# Patient Record
Sex: Female | Born: 1995 | Race: Black or African American | Hispanic: No | Marital: Single | Smoking: Never smoker
Health system: Southern US, Community
[De-identification: ages and names within clinical notes are randomized; demographics above are authoritative.]

## PROBLEM LIST (undated history)

## (undated) ENCOUNTER — Inpatient Hospital Stay (HOSPITAL_COMMUNITY): Payer: Self-pay

## (undated) DIAGNOSIS — E282 Polycystic ovarian syndrome: Secondary | ICD-10-CM

## (undated) DIAGNOSIS — I10 Essential (primary) hypertension: Secondary | ICD-10-CM

## (undated) DIAGNOSIS — Z6841 Body Mass Index (BMI) 40.0 and over, adult: Secondary | ICD-10-CM

## (undated) DIAGNOSIS — R7303 Prediabetes: Secondary | ICD-10-CM

## (undated) DIAGNOSIS — Z8759 Personal history of other complications of pregnancy, childbirth and the puerperium: Secondary | ICD-10-CM

## (undated) HISTORY — DX: Body Mass Index (BMI) 40.0 and over, adult: Z684

## (undated) HISTORY — DX: Prediabetes: R73.03

## (undated) HISTORY — PX: KNEE SURGERY: SHX244

## (undated) HISTORY — DX: Polycystic ovarian syndrome: E28.2

## (undated) HISTORY — DX: Personal history of other complications of pregnancy, childbirth and the puerperium: Z87.59

## (undated) HISTORY — PX: WISDOM TOOTH EXTRACTION: SHX21

## (undated) HISTORY — DX: Essential (primary) hypertension: I10

---

## 2016-08-05 NOTE — L&D Delivery Note (Signed)
Pre-Op: IUP 16 0/7 weeks SROM  Post-Op: SAA  Procedure: SVD  Anesthesia: None  Attending: M. Baylor Teegarden  Findings: non viable female infant at 1835, 3 vessel cord, intact placenta, no lacerations  Procedure:  Pt was admitted with above Dx. Received Cytotec vaginal. Felt pressure and went to restroom. Delivered non viable female infant at [id number]. Cord clamped and cut by nursing. Placenta spontaneously at [id number]. Intact. Infant expired at 1843. No lacerations or tears noted. Min bleeding. Mother held baby. Appropriate grief response.  Placenta to pathology.

## 2016-11-29 ENCOUNTER — Ambulatory Visit (INDEPENDENT_AMBULATORY_CARE_PROVIDER_SITE_OTHER): Payer: BLUE CROSS/BLUE SHIELD

## 2016-11-29 ENCOUNTER — Encounter: Payer: Self-pay | Admitting: *Deleted

## 2016-11-29 DIAGNOSIS — Z3201 Encounter for pregnancy test, result positive: Secondary | ICD-10-CM

## 2016-11-29 DIAGNOSIS — Z3200 Encounter for pregnancy test, result unknown: Secondary | ICD-10-CM

## 2016-11-29 LAB — POCT URINE PREGNANCY: Preg Test, Ur: POSITIVE — AB

## 2016-11-29 NOTE — Progress Notes (Signed)
Patient presents for UPT.  Pregnancy Test is POSITIVE. LMP 09/13/16.  11w 0d. EDD 06/20/17.  Patient advised to scheduled NEW OB appt.

## 2016-12-08 ENCOUNTER — Encounter (HOSPITAL_COMMUNITY): Payer: Self-pay | Admitting: *Deleted

## 2016-12-08 ENCOUNTER — Inpatient Hospital Stay (HOSPITAL_COMMUNITY)
Admission: AD | Admit: 2016-12-08 | Discharge: 2016-12-08 | Disposition: A | Discharge status: Home / Self Care | Payer: BLUE CROSS/BLUE SHIELD | Source: Ambulatory Visit | Attending: Obstetrics and Gynecology | Admitting: Obstetrics and Gynecology

## 2016-12-08 ENCOUNTER — Ambulatory Visit (HOSPITAL_COMMUNITY)
Admission: EM | Admit: 2016-12-08 | Discharge: 2016-12-08 | Disposition: A | Discharge status: Nursing Home (Includes ICF) | Payer: BLUE CROSS/BLUE SHIELD

## 2016-12-08 DIAGNOSIS — Z79899 Other long term (current) drug therapy: Secondary | ICD-10-CM | POA: Diagnosis not present

## 2016-12-08 DIAGNOSIS — O26891 Other specified pregnancy related conditions, first trimester: Secondary | ICD-10-CM | POA: Insufficient documentation

## 2016-12-08 DIAGNOSIS — O219 Vomiting of pregnancy, unspecified: Secondary | ICD-10-CM | POA: Insufficient documentation

## 2016-12-08 DIAGNOSIS — Z3A12 12 weeks gestation of pregnancy: Secondary | ICD-10-CM | POA: Insufficient documentation

## 2016-12-08 DIAGNOSIS — R079 Chest pain, unspecified: Secondary | ICD-10-CM | POA: Diagnosis not present

## 2016-12-08 LAB — URINALYSIS, ROUTINE W REFLEX MICROSCOPIC
Bilirubin Urine: NEGATIVE
Glucose, UA: NEGATIVE mg/dL
Hgb urine dipstick: NEGATIVE
Ketones, ur: NEGATIVE mg/dL
Nitrite: NEGATIVE
Protein, ur: 30 mg/dL — AB
Specific Gravity, Urine: 1.023 (ref 1.005–1.030)
pH: 7 (ref 5.0–8.0)

## 2016-12-08 MED ORDER — RANITIDINE HCL 150 MG PO CAPS: 150.0000 mg | ORAL_CAPSULE | Freq: Every day | ORAL | 0 refills | Status: DC

## 2016-12-08 MED ORDER — PROMETHAZINE HCL 25 MG PO TABS: 25.0000 mg | ORAL_TABLET | Freq: Four times a day (QID) | ORAL | 0 refills | Status: DC | PRN

## 2016-12-08 MED ORDER — VITAFOL GUMMIES 3.33-0.333-34.8 MG PO CHEW: 3.0000 | CHEWABLE_TABLET | Freq: Every day | ORAL | 2 refills | Status: AC

## 2016-12-08 NOTE — MAU Note (Signed)
Patient states she has started vomiting more blood with her episodes of emesis. States it started as streaks. Reports left sided mid chest pain that start last night--intermittent pain. States she feels like her pulse is high.

## 2016-12-08 NOTE — Discharge Instructions (Signed)
Get your prescriptions at the pharmacy. Take the gummies daily. Take the Zantac twice daily. Keep your appointment for prenatal care.  Eat bland, easy to digest foods in small amounts as you are able, such as: ? Bananas. ? Applesauce. ? Rice. ? Lean meats. ? Toast. ? Crackers. Do not eat oils or fried foods.  Drink at least 8 8-oz glasses of water every day. Take Tylenol 325 mg 2 tablets by mouth every 4 hours if needed for pain.

## 2016-12-08 NOTE — MAU Provider Note (Signed)
History     CSN: [id number]  Arrival date and time: 12/08/16 1233   First Provider Initiated Contact with Patient 12/08/16 1300      Chief Complaint  Patient presents with  . Chest Pain  . Tachycardia  . Emesis   HPI Lisa Faulkner 20 y.o. 3943w2d  Comes in today with vomiting blood this morning and feeling periodic pain in her chest.  Has had vomiting almost daily throughout the pregnancy.  Has had streaks of blood in the vomitus, but today had blood in her mouth that she could taste.  Client is very worried as she does not think she is getting enough food in the pregnancy although lots of times when she is vomiting, it is green or yellow - not food particles.   Eats small amounts.  Vomits approximately 3 times a day.  Has not tried crackers before getting up in the morning.  Is not able to take her prenatal vitamins.  Has her first prenatal care appointment on May 10.  Has had a long history of GI problems.  When she lived in [place] in 2017, she was told she likely had irritable bowel problems.  Also her mother had a history of vomiting throughout her pregnancies.    OB History    Gravida Para Term Preterm AB Living   1             SAB TAB Ectopic Multiple Live Births                  Past Medical History:  Diagnosis Date  . Medical history non-contributory     Past Surgical History:  Procedure Laterality Date  . KNEE SURGERY    . WISDOM TOOTH EXTRACTION      Family History  Problem Relation Age of Onset  . Hypertension Mother   . Hypertension Father   . Hypertension Brother   . Heart disease Brother     Social History  Substance Use Topics  . Smoking status: Never Smoker  . Smokeless tobacco: Never Used  . Alcohol use No    Allergies: No Known Allergies  Prescriptions Prior to Admission  Medication Sig Dispense Refill Last Dose  . calcium carbonate (TUMS - DOSED IN MG ELEMENTAL CALCIUM) 500 MG chewable tablet Chew 1 tablet by mouth every 3 (three)  hours as needed for indigestion or heartburn.   12/07/2016 at Unknown time    Review of Systems  Constitutional: Negative for fever.  Respiratory: Negative for chest tightness and shortness of breath.   Cardiovascular: Positive for chest pain.  Gastrointestinal:       Having upper abdominal pain primarily on left side and midline.  Genitourinary: Negative for vaginal bleeding and vaginal discharge.   Physical Exam   Blood pressure 130/68, pulse 91, temperature 98.2 F (36.8 C), temperature source Oral, resp. rate 18, height 5\' 5"  (1.651 m), weight 264 lb (119.7 kg), last menstrual period 09/13/2016, SpO2 100 %.  Physical Exam  Nursing note and vitals reviewed. Constitutional: She is oriented to person, place, and time. She appears well-developed and well-nourished.  HENT:  Head: Normocephalic.  Eyes: EOM are normal.  Neck: Neck supple.  Cardiovascular: Normal rate and regular rhythm.   Respiratory: Effort normal. No respiratory distress.  GI: Soft. There is no tenderness. There is no rebound and no guarding.  FHT heard by doppler.  Musculoskeletal: Normal range of motion.  Neurological: She is alert and oriented to person, place, and time.  Skin:  Skin is warm and dry.  Psychiatric: She has a normal mood and affect.    MAU Course  Procedures Results for orders placed or performed during the hospital encounter of 12/08/16 (from the past 24 hour(s))  Urinalysis, Routine w reflex microscopic     Status: Abnormal   Collection Time: 12/08/16 12:41 PM  Result Value Ref Range   Color, Urine YELLOW YELLOW   APPearance HAZY (A) CLEAR   Specific Gravity, Urine 1.023 1.005 - 1.030   pH 7.0 5.0 - 8.0   Glucose, UA NEGATIVE NEGATIVE mg/dL   Hgb urine dipstick NEGATIVE NEGATIVE   Bilirubin Urine NEGATIVE NEGATIVE   Ketones, ur NEGATIVE NEGATIVE mg/dL   Protein, ur 30 (A) NEGATIVE mg/dL   Nitrite NEGATIVE NEGATIVE   Leukocytes, UA LARGE (A) NEGATIVE   RBC / HPF 6-30 0 - 5 RBC/hpf    WBC, UA TOO NUMEROUS TO COUNT 0 - 5 WBC/hpf   Bacteria, UA RARE (A) NONE SEEN   Squamous Epithelial / LPF TOO NUMEROUS TO COUNT (A) NONE SEEN   Mucous PRESENT    MDM The bleeding she had with vomiting is likely normal due to irritated lining of the throat.  Discussed with client.  Will try to calm heartburn - Tums are not working for her.  Discussed dietary habits with vomiting.  And noted that she is not spilling ketones so she is getting enough calorie intake.  Discussed chest pain - not extensive and is not worsening - may even be chest wall pain due to the vomiting.  Advised to seek care if chest pain is continuous and has pressure especially if she is sweating or feeling faint.  Has no UTI symptoms and this is a contaminated specimen.  Will check urine again this week at the office.  Client is much more at ease after discussion.  Ready for discharge.  Assessment and Plan  Nausea and vomiting in Pregnancy  Plan Will prescribe Zantac to take BID Will Prescribe Phenergan to take 12.5 mg PO as needed if vomiting is severe Will prescribe gummy PNV to see if they are better tolerated. Advised to keep appointment at [facility] on May 10.  Joshue Badal L Tamari Redwine 12/08/2016, 2:00 PM

## 2016-12-17 ENCOUNTER — Encounter: Payer: Self-pay | Admitting: Family Medicine

## 2016-12-17 ENCOUNTER — Ambulatory Visit (INDEPENDENT_AMBULATORY_CARE_PROVIDER_SITE_OTHER): Payer: BLUE CROSS/BLUE SHIELD | Admitting: Family Medicine

## 2016-12-17 DIAGNOSIS — Z113 Encounter for screening for infections with a predominantly sexual mode of transmission: Secondary | ICD-10-CM | POA: Diagnosis not present

## 2016-12-17 DIAGNOSIS — Z124 Encounter for screening for malignant neoplasm of cervix: Secondary | ICD-10-CM | POA: Diagnosis not present

## 2016-12-17 DIAGNOSIS — K5900 Constipation, unspecified: Secondary | ICD-10-CM | POA: Insufficient documentation

## 2016-12-17 DIAGNOSIS — Z3480 Encounter for supervision of other normal pregnancy, unspecified trimester: Secondary | ICD-10-CM | POA: Insufficient documentation

## 2016-12-17 DIAGNOSIS — Z3481 Encounter for supervision of other normal pregnancy, first trimester: Secondary | ICD-10-CM | POA: Diagnosis not present

## 2016-12-17 DIAGNOSIS — O219 Vomiting of pregnancy, unspecified: Secondary | ICD-10-CM

## 2016-12-17 DIAGNOSIS — Z3689 Encounter for other specified antenatal screening: Secondary | ICD-10-CM | POA: Diagnosis not present

## 2016-12-17 DIAGNOSIS — K5901 Slow transit constipation: Secondary | ICD-10-CM

## 2016-12-17 DIAGNOSIS — O99211 Obesity complicating pregnancy, first trimester: Secondary | ICD-10-CM

## 2016-12-17 MED ORDER — DOXYLAMINE-PYRIDOXINE ER 20-20 MG PO TBCR: 1.0000 | EXTENDED_RELEASE_TABLET | Freq: Two times a day (BID) | ORAL | 6 refills | Status: DC

## 2016-12-17 MED ORDER — DOCUSATE SODIUM 100 MG PO CAPS: 100.0000 mg | ORAL_CAPSULE | Freq: Two times a day (BID) | ORAL | 2 refills | Status: DC | PRN

## 2016-12-17 NOTE — Progress Notes (Signed)
    Subjective:    Lisa Faulkner is a G1P0 5447w4d being seen today for her first obstetrical visit.  Her obstetrical history is significant for obesity.Pregnancy history fully reviewed.  Patient reports nausea and constipation. She also reports epigastric pain and heartburn.  There were no vitals filed for this visit.  HISTORY: OB History  Gravida Para Term Preterm AB Living  1            SAB TAB Ectopic Multiple Live Births               # Outcome Date GA Lbr Len/2nd Weight Sex Delivery Anes PTL Lv  1 Current              Past Medical History:  Diagnosis Date  . Medical history non-contributory   . PCOS (polycystic ovarian syndrome)    Past Surgical History:  Procedure Laterality Date  . KNEE SURGERY    . WISDOM TOOTH EXTRACTION     Family History  Problem Relation Age of Onset  . Hypertension Mother   . Hypertension Father   . Hypertension Brother   . Heart disease Brother      Exam    Uterus:   14 wk size  Pelvic Exam:    Perineum: Normal Perineum   Vulva: Bartholin's, Urethra, Skene's normal   Vagina:  normal mucosa, normal discharge   Cervix: nulliparous appearance   Adnexa: normal adnexa   Bony Pelvis: average  System: Breast:  normal appearance, no masses or tenderness   Skin: normal coloration and turgor, no rashes    Neurologic: oriented   Extremities: normal strength, tone, and muscle mass   HEENT extra ocular movement intact and sclera clear, anicteric   Mouth/Teeth mucous membranes moist, pharynx normal without lesions   Neck supple   Cardiovascular: regular rate and rhythm, no murmurs or gallops   Respiratory:  appears well, vitals normal, no respiratory distress, acyanotic, normal RR, ear and throat exam is normal, neck free of mass or lymphadenopathy, chest clear, no wheezing, crepitations, rhonchi, normal symmetric air entry   Abdomen: soft, non-tender; bowel sounds normal; no masses,  no organomegaly      Assessment/Plan:    Pregnancy: G1P0 1. Supervision of other normal pregnancy, antepartum New OB history reviewed Too late for first screen Options reviewed--offer quad--still considering. - Cytology - PAP - Culture, OB Urine - Glucose, random - Hemoglobin A1c - Inheritest Society Guided - US bedside; Future - US MFM OB COMP + 14 WK; Future - Obstetric Panel, Including HIV  2. Slow transit constipation Increase water and fiber - docusate sodium (COLACE) 100 MG capsule; Take 1 capsule (100 mg total) by mouth 2 (two) times daily as needed.  Dispense: 30 capsule; Refill: 2  3. Nausea and vomiting of pregnancy, antepartum Use Bonjesta for prevention and only phenergan as needed. - Doxylamine-Pyridoxine ER (BONJESTA) 20-20 MG TBCR; Take 1 tablet by mouth 2 (two) times daily.  Dispense: 60 tablet; Refill: 6  Reva Boresanya S Wynell Halberg 12/17/2016

## 2016-12-17 NOTE — Patient Instructions (Signed)
Breastfeeding Deciding to breastfeed is one of the best choices you can make for you and your baby. A change in hormones during pregnancy causes your breast tissue to grow and increases the number and size of your milk ducts. These hormones also allow proteins, sugars, and fats from your blood supply to make breast milk in your milk-producing glands. Hormones prevent breast milk from being released before your baby is born as well as prompt milk flow after birth. Once breastfeeding has begun, thoughts of your baby, as well as his or her sucking or crying, can stimulate the release of milk from your milk-producing glands. Benefits of breastfeeding For Your Baby  Your first milk (colostrum) helps your baby's digestive system function better.  There are antibodies in your milk that help your baby fight off infections.  Your baby has a lower incidence of asthma, allergies, and sudden infant death syndrome.  The nutrients in breast milk are better for your baby than infant formulas and are designed uniquely for your baby's needs.  Breast milk improves your baby's brain development.  Your baby is less likely to develop other conditions, such as childhood obesity, asthma, or type 2 diabetes mellitus. For You  Breastfeeding helps to create a very special bond between you and your baby.  Breastfeeding is convenient. Breast milk is always available at the correct temperature and costs nothing.  Breastfeeding helps to burn calories and helps you lose the weight gained during pregnancy.  Breastfeeding makes your uterus contract to its prepregnancy size faster and slows bleeding (lochia) after you give birth.  Breastfeeding helps to lower your risk of developing type 2 diabetes mellitus, osteoporosis, and breast or ovarian cancer later in life. Signs that your baby is hungry Early Signs of Hunger  Increased alertness or activity.  Stretching.  Movement of the head from side to side.  Movement  of the head and opening of the mouth when the corner of the mouth or cheek is stroked (rooting).  Increased sucking sounds, smacking lips, cooing, sighing, or squeaking.  Hand-to-mouth movements.  Increased sucking of fingers or hands. Late Signs of Hunger  Fussing.  Intermittent crying. Extreme Signs of Hunger  Signs of extreme hunger will require calming and consoling before your baby will be able to breastfeed successfully. Do not wait for the following signs of extreme hunger to occur before you initiate breastfeeding:  Restlessness.  A loud, strong cry.  Screaming. Breastfeeding basics  Breastfeeding Initiation  Find a comfortable place to sit or lie down, with your neck and back well supported.  Place a pillow or rolled up blanket under your baby to bring him or her to the level of your breast (if you are seated). Nursing pillows are specially designed to help support your arms and your baby while you breastfeed.  Make sure that your baby's abdomen is facing your abdomen.  Gently massage your breast. With your fingertips, massage from your chest wall toward your nipple in a circular motion. This encourages milk flow. You may need to continue this action during the feeding if your milk flows slowly.  Support your breast with 4 fingers underneath and your thumb above your nipple. Make sure your fingers are well away from your nipple and your baby's mouth.  Stroke your baby's lips gently with your finger or nipple.  When your baby's mouth is open wide enough, quickly bring your baby to your breast, placing your entire nipple and as much of the colored area around your nipple (  areola) as possible into your baby's mouth.  More areola should be visible above your baby's upper lip than below the lower lip.  Your baby's tongue should be between his or her lower gum and your breast.  Ensure that your baby's mouth is correctly positioned around your nipple (latched). Your baby's  lips should create a seal on your breast and be turned out (everted).  It is common for your baby to suck about 2-3 minutes in order to start the flow of breast milk. Latching  Teaching your baby how to latch on to your breast properly is very important. An improper latch can cause nipple pain and decreased milk supply for you and poor weight gain in your baby. Also, if your baby is not latched onto your nipple properly, he or she may swallow some air during feeding. This can make your baby fussy. Burping your baby when you switch breasts during the feeding can help to get rid of the air. However, teaching your baby to latch on properly is still the best way to prevent fussiness from swallowing air while breastfeeding. Signs that your baby has successfully latched on to your nipple:  Silent tugging or silent sucking, without causing you pain.  Swallowing heard between every 3-4 sucks.  Muscle movement above and in front of his or her ears while sucking. Signs that your baby has not successfully latched on to nipple:  Sucking sounds or smacking sounds from your baby while breastfeeding.  Nipple pain. If you think your baby has not latched on correctly, slip your finger into the corner of your baby's mouth to break the suction and place it between your baby's gums. Attempt breastfeeding initiation again. Signs of Successful Breastfeeding  Signs from your baby:  A gradual decrease in the number of sucks or complete cessation of sucking.  Falling asleep.  Relaxation of his or her body.  Retention of a small amount of milk in his or her mouth.  Letting go of your breast by himself or herself. Signs from you:  Breasts that have increased in firmness, weight, and size 1-3 hours after feeding.  Breasts that are softer immediately after breastfeeding.  Increased milk volume, as well as a change in milk consistency and color by the fifth day of breastfeeding.  Nipples that are not sore,  cracked, or bleeding. Signs That Your Baby is Getting Enough Milk  Wetting at least 1-2 diapers during the first 24 hours after birth.  Wetting at least 5-6 diapers every 24 hours for the first week after birth. The urine should be clear or pale yellow by 5 days after birth.  Wetting 6-8 diapers every 24 hours as your baby continues to grow and develop.  At least 3 stools in a 24-hour period by age 5 days. The stool should be soft and yellow.  At least 3 stools in a 24-hour period by age 7 days. The stool should be seedy and yellow.  No loss of weight greater than 10% of birth weight during the first 3 days of age.  Average weight gain of 4-7 ounces (113-198 g) per week after age 4 days.  Consistent daily weight gain by age 5 days, without weight loss after the age of 2 weeks. After a feeding, your baby may spit up a small amount. This is common. Breastfeeding frequency and duration Frequent feeding will help you make more milk and can prevent sore nipples and breast engorgement. Breastfeed when you feel the need to reduce the   fullness of your breasts or when your baby shows signs of hunger. This is called "breastfeeding on demand." Avoid introducing a pacifier to your baby while you are working to establish breastfeeding (the first 4-6 weeks after your baby is born). After this time you may choose to use a pacifier. Research has shown that pacifier use during the first year of a baby's life decreases the risk of sudden infant death syndrome (SIDS). Allow your baby to feed on each breast as long as he or she wants. Breastfeed until your baby is finished feeding. When your baby unlatches or falls asleep while feeding from the first breast, offer the second breast. Because newborns are often sleepy in the first few weeks of life, you may need to awaken your baby to get him or her to feed. Breastfeeding times will vary from baby to baby. However, the following rules can serve as a guide to help  you ensure that your baby is properly fed:  Newborns (babies 4 weeks of age or younger) may breastfeed every 1-3 hours.  Newborns should not go longer than 3 hours during the day or 5 hours during the night without breastfeeding.  You should breastfeed your baby a minimum of 8 times in a 24-hour period until you begin to introduce solid foods to your baby at around 6 months of age. Breast milk pumping Pumping and storing breast milk allows you to ensure that your baby is exclusively fed your breast milk, even at times when you are unable to breastfeed. This is especially important if you are going back to work while you are still breastfeeding or when you are not able to be present during feedings. Your lactation consultant can give you guidelines on how long it is safe to store breast milk. A breast pump is a machine that allows you to pump milk from your breast into a sterile bottle. The pumped breast milk can then be stored in a refrigerator or freezer. Some breast pumps are operated by hand, while others use electricity. Ask your lactation consultant which type will work best for you. Breast pumps can be purchased, but some hospitals and breastfeeding support groups lease breast pumps on a monthly basis. A lactation consultant can teach you how to hand express breast milk, if you prefer not to use a pump. Caring for your breasts while you breastfeed Nipples can become dry, cracked, and sore while breastfeeding. The following recommendations can help keep your breasts moisturized and healthy:  Avoid using soap on your nipples.  Wear a supportive bra. Although not required, special nursing bras and tank tops are designed to allow access to your breasts for breastfeeding without taking off your entire bra or top. Avoid wearing underwire-style bras or extremely tight bras.  Air dry your nipples for 3-4minutes after each feeding.  Use only cotton bra pads to absorb leaked breast milk. Leaking of  breast milk between feedings is normal.  Use lanolin on your nipples after breastfeeding. Lanolin helps to maintain your skin's normal moisture barrier. If you use pure lanolin, you do not need to wash it off before feeding your baby again. Pure lanolin is not toxic to your baby. You may also hand express a few drops of breast milk and gently massage that milk into your nipples and allow the milk to air dry. In the first few weeks after giving birth, some women experience extremely full breasts (engorgement). Engorgement can make your breasts feel heavy, warm, and tender to the touch.   Engorgement peaks within 3-5 days after you give birth. The following recommendations can help ease engorgement:  Completely empty your breasts while breastfeeding or pumping. You may want to start by applying warm, moist heat (in the shower or with warm water-soaked hand towels) just before feeding or pumping. This increases circulation and helps the milk flow. If your baby does not completely empty your breasts while breastfeeding, pump any extra milk after he or she is finished.  Wear a snug bra (nursing or regular) or tank top for 1-2 days to signal your body to slightly decrease milk production.  Apply ice packs to your breasts, unless this is too uncomfortable for you.  Make sure that your baby is latched on and positioned properly while breastfeeding. If engorgement persists after 48 hours of following these recommendations, contact your health care provider or a lactation consultant. Overall health care recommendations while breastfeeding  Eat healthy foods. Alternate between meals and snacks, eating 3 of each per day. Because what you eat affects your breast milk, some of the foods may make your baby more irritable than usual. Avoid eating these foods if you are sure that they are negatively affecting your baby.  Drink milk, fruit juice, and water to satisfy your thirst (about 10 glasses a day).  Rest often,  relax, and continue to take your prenatal vitamins to prevent fatigue, stress, and anemia.  Continue breast self-awareness checks.  Avoid chewing and smoking tobacco. Chemicals from cigarettes that pass into breast milk and exposure to secondhand smoke may harm your baby.  Avoid alcohol and drug use, including marijuana. Some medicines that may be harmful to your baby can pass through breast milk. It is important to ask your health care provider before taking any medicine, including all over-the-counter and prescription medicine as well as vitamin and herbal supplements. It is possible to become pregnant while breastfeeding. If birth control is desired, ask your health care provider about options that will be safe for your baby. Contact a health care provider if:  You feel like you want to stop breastfeeding or have become frustrated with breastfeeding.  You have painful breasts or nipples.  Your nipples are cracked or bleeding.  Your breasts are red, tender, or warm.  You have a swollen area on either breast.  You have a fever or chills.  You have nausea or vomiting.  You have drainage other than breast milk from your nipples.  Your breasts do not become full before feedings by the fifth day after you give birth.  You feel sad and depressed.  Your baby is too sleepy to eat well.  Your baby is having trouble sleeping.  Your baby is wetting less than 3 diapers in a 24-hour period.  Your baby has less than 3 stools in a 24-hour period.  Your baby's skin or the white part of his or her eyes becomes yellow.  Your baby is not gaining weight by 5 days of age. Get help right away if:  Your baby is overly tired (lethargic) and does not want to wake up and feed.  Your baby develops an unexplained fever. This information is not intended to replace advice given to you by your health care provider. Make sure you discuss any questions you have with your health care  provider. Document Released: 07/22/2005 Document Revised: 01/03/2016 Document Reviewed: 01/13/2013 Elsevier Interactive Patient Education  2017 [employer] Second Trimester of Pregnancy The second trimester is from week 13 through week 28, month 4 through   6. This is often the time in pregnancy that you feel your best. Often times, morning sickness has lessened or quit. You may have more energy, and you may get hungry more often. Your unborn baby (fetus) is growing rapidly. At the end of the sixth month, he or she is about 9 inches long and weighs about 1 pounds. You will likely feel the baby move (quickening) between 18 and 20 weeks of pregnancy. Follow these instructions at home:  Avoid all smoking, herbs, and alcohol. Avoid drugs not approved by your doctor.  Do not use any tobacco products, including cigarettes, chewing tobacco, and electronic cigarettes. If you need help quitting, ask your doctor. You may get counseling or other support to help you quit.  Only take medicine as told by your doctor. Some medicines are safe and some are not during pregnancy.  Exercise only as told by your doctor. Stop exercising if you start having cramps.  Eat regular, healthy meals.  Wear a good support bra if your breasts are tender.  Do not use hot tubs, steam rooms, or saunas.  Wear your seat belt when driving.  Avoid raw meat, uncooked cheese, and liter boxes and soil used by cats.  Take your prenatal vitamins.  Take 1500-2000 milligrams of calcium daily starting at the 20th week of pregnancy until you deliver your baby.  Try taking medicine that helps you poop (stool softener) as needed, and if your doctor approves. Eat more fiber by eating fresh fruit, vegetables, and whole grains. Drink enough fluids to keep your pee (urine) clear or pale yellow.  Take warm water baths (sitz baths) to soothe pain or discomfort caused by hemorrhoids. Use hemorrhoid cream if your doctor approves.  If you  have puffy, bulging veins (varicose veins), wear support hose. Raise (elevate) your feet for 15 minutes, 3-4 times a day. Limit salt in your diet.  Avoid heavy lifting, wear low heals, and sit up straight.  Rest with your legs raised if you have leg cramps or low back pain.  Visit your dentist if you have not gone during your pregnancy. Use a soft toothbrush to brush your teeth. Be gentle when you floss.  You can have sex (intercourse) unless your doctor tells you not to.  Go to your doctor visits. Get help if:  You feel dizzy.  You have mild cramps or pressure in your lower belly (abdomen).  You have a nagging pain in your belly area.  You continue to feel sick to your stomach (nauseous), throw up (vomit), or have watery poop (diarrhea).  You have bad smelling fluid coming from your vagina.  You have pain with peeing (urination). Get help right away if:  You have a fever.  You are leaking fluid from your vagina.  You have spotting or bleeding from your vagina.  You have severe belly cramping or pain.  You lose or gain weight rapidly.  You have trouble catching your breath and have chest pain.  You notice sudden or extreme puffiness (swelling) of your face, hands, ankles, feet, or legs.  You have not felt the baby move in over an hour.  You have severe headaches that do not go away with medicine.  You have vision changes. This information is not intended to replace advice given to you by your health care provider. Make sure you discuss any questions you have with your health care provider. Document Released: 10/16/2009 Document Revised: 12/28/2015 Document Reviewed: 09/22/2012 Elsevier Interactive Patient Education  2017 Elsevier   Inc.  

## 2016-12-17 NOTE — Progress Notes (Signed)
Bedside US shows SLIUP with FHR 154bpm [facility]d [facility] with LMP of 2101w4d

## 2016-12-19 LAB — OBSTETRIC PANEL, INCLUDING HIV
Antibody Screen: NEGATIVE
Basophils Absolute: 0 10*3/uL (ref 0.0–0.2)
Basos: 0 %
EOS (ABSOLUTE): 0.2 10*3/uL (ref 0.0–0.4)
Eos: 2 %
HIV Screen 4th Generation wRfx: NONREACTIVE
Hematocrit: 33.5 % — ABNORMAL LOW (ref 34.0–46.6)
Hemoglobin: 11.2 g/dL (ref 11.1–15.9)
Hepatitis B Surface Ag: NEGATIVE
Immature Grans (Abs): 0 10*3/uL (ref 0.0–0.1)
Immature Granulocytes: 0 %
Lymphocytes Absolute: 2.2 10*3/uL (ref 0.7–3.1)
Lymphs: 19 %
MCH: 29.3 pg (ref 26.6–33.0)
MCHC: 33.4 g/dL (ref 31.5–35.7)
MCV: 88 fL (ref 79–97)
Monocytes Absolute: 0.5 10*3/uL (ref 0.1–0.9)
Monocytes: 4 %
Neutrophils Absolute: 8.7 10*3/uL — ABNORMAL HIGH (ref 1.4–7.0)
Neutrophils: 75 %
Platelets: 269 10*3/uL (ref 150–379)
RBC: 3.82 x10E6/uL (ref 3.77–5.28)
RDW: 13.7 % (ref 12.3–15.4)
RPR Ser Ql: NONREACTIVE
Rh Factor: POSITIVE
Rubella Antibodies, IGG: 1.05 index (ref >0.99)
WBC: 11.5 10*3/uL — ABNORMAL HIGH (ref 3.4–10.8)

## 2016-12-19 LAB — CYTOLOGY - PAP
Chlamydia: NEGATIVE
Diagnosis: NEGATIVE
Neisseria Gonorrhea: NEGATIVE

## 2016-12-19 LAB — URINE CULTURE, OB REFLEX

## 2016-12-19 LAB — HEMOGLOBIN A1C
Est. average glucose Bld gHb Est-mCnc: 123 mg/dL
Hgb A1c MFr Bld: 5.9 % — ABNORMAL HIGH (ref 4.8–5.6)

## 2016-12-19 LAB — GLUCOSE, RANDOM: Glucose: 73 mg/dL (ref 65–99)

## 2016-12-19 LAB — CULTURE, OB URINE

## 2017-01-01 ENCOUNTER — Telehealth: Payer: Self-pay | Admitting: *Deleted

## 2017-01-01 NOTE — Telephone Encounter (Signed)
Informed pt that rx was sent to pharmacy on 12-17-16 and to contact them to have it filled, pt acknowledged.

## 2017-01-01 NOTE — Telephone Encounter (Signed)
-----   Message from Lindell SparHeather L Bacon, [place] sent at 01/01/2017 10:13 AM EDT ----- Regarding: Rx Request  Contact: [phone] Pt states that she was given a sample of [place] and request that a Rx be called [facility] to [facility] [place] for this medication.   thanks

## 2017-01-03 ENCOUNTER — Inpatient Hospital Stay (HOSPITAL_COMMUNITY)
Admission: AD | Admit: 2017-01-03 | Discharge: 2017-01-04 | DRG: 779 | Disposition: A | Discharge status: Home / Self Care | Payer: BLUE CROSS/BLUE SHIELD | Attending: Obstetrics and Gynecology | Admitting: Obstetrics and Gynecology

## 2017-01-03 ENCOUNTER — Inpatient Hospital Stay (HOSPITAL_COMMUNITY): Payer: BLUE CROSS/BLUE SHIELD

## 2017-01-03 ENCOUNTER — Encounter (HOSPITAL_COMMUNITY): Payer: Self-pay | Admitting: *Deleted

## 2017-01-03 DIAGNOSIS — Z3A16 16 weeks gestation of pregnancy: Secondary | ICD-10-CM

## 2017-01-03 DIAGNOSIS — O42919 Preterm premature rupture of membranes, unspecified as to length of time between rupture and onset of labor, unspecified trimester: Secondary | ICD-10-CM

## 2017-01-03 DIAGNOSIS — O42012 Preterm premature rupture of membranes, onset of labor within 24 hours of rupture, second trimester: Secondary | ICD-10-CM

## 2017-01-03 DIAGNOSIS — Z3480 Encounter for supervision of other normal pregnancy, unspecified trimester: Secondary | ICD-10-CM

## 2017-01-03 DIAGNOSIS — O039 Complete or unspecified spontaneous abortion without complication: Secondary | ICD-10-CM | POA: Diagnosis present

## 2017-01-03 DIAGNOSIS — O42912 Preterm premature rupture of membranes, unspecified as to length of time between rupture and onset of labor, second trimester: Secondary | ICD-10-CM | POA: Diagnosis not present

## 2017-01-03 DIAGNOSIS — O4692 Antepartum hemorrhage, unspecified, second trimester: Secondary | ICD-10-CM

## 2017-01-03 LAB — CBC WITH DIFFERENTIAL/PLATELET
Basophils Absolute: 0 10*3/uL (ref 0.0–0.1)
Basophils Relative: 0 %
Eosinophils Absolute: 0.1 10*3/uL (ref 0.0–0.7)
Eosinophils Relative: 1 %
HCT: 31.1 % — ABNORMAL LOW (ref 36.0–46.0)
Hemoglobin: 10.9 g/dL — ABNORMAL LOW (ref 12.0–15.0)
Lymphocytes Relative: 15 %
Lymphs Abs: 2.6 10*3/uL (ref 0.7–4.0)
MCH: 29.7 pg (ref 26.0–34.0)
MCHC: 35 g/dL (ref 30.0–36.0)
MCV: 84.7 fL (ref 78.0–100.0)
Monocytes Absolute: 0.3 10*3/uL (ref 0.1–1.0)
Monocytes Relative: 2 %
Neutro Abs: 14.2 10*3/uL — ABNORMAL HIGH (ref 1.7–7.7)
Neutrophils Relative %: 82 %
Platelets: 224 10*3/uL (ref 150–400)
RBC: 3.67 MIL/uL — ABNORMAL LOW (ref 3.87–5.11)
RDW: 13.4 % (ref 11.5–15.5)
WBC: 17.2 10*3/uL — ABNORMAL HIGH (ref 4.0–10.5)

## 2017-01-03 LAB — TYPE AND SCREEN
ABO/RH(D): O POS
Antibody Screen: NEGATIVE

## 2017-01-03 LAB — ABO/RH: ABO/RH(D): O POS

## 2017-01-03 MED ORDER — FENTANYL CITRATE (PF) 100 MCG/2ML IJ SOLN
100.0000 ug | INTRAMUSCULAR | Status: DC | PRN
  Administered 2017-01-03: 100 ug via INTRAVENOUS
  Filled 2017-01-03: qty 2

## 2017-01-03 MED ORDER — ACETAMINOPHEN 325 MG PO TABS
650.0000 mg | ORAL_TABLET | ORAL | Status: DC | PRN
  Administered 2017-01-03: 650 mg via ORAL
  Filled 2017-01-03: qty 2

## 2017-01-03 MED ORDER — PRENATAL MULTIVITAMIN CH: 1.0000 | ORAL_TABLET | Freq: Every day | ORAL | Status: DC

## 2017-01-03 MED ORDER — MISOPROSTOL 200 MCG PO TABS
600.0000 ug | ORAL_TABLET | ORAL | Status: DC | PRN
  Administered 2017-01-03: 600 ug via VAGINAL
  Filled 2017-01-03: qty 3

## 2017-01-03 MED ORDER — DOCUSATE SODIUM 100 MG PO CAPS
100.0000 mg | ORAL_CAPSULE | Freq: Every day | ORAL | Status: DC
  Filled 2017-01-03 (×2): qty 1

## 2017-01-03 MED ORDER — IBUPROFEN 800 MG PO TABS: 800.0000 mg | ORAL_TABLET | Freq: Three times a day (TID) | ORAL | 1 refills | Status: DC | PRN

## 2017-01-03 MED ORDER — LACTATED RINGERS IV SOLN: INTRAVENOUS | Status: DC

## 2017-01-03 MED ORDER — CALCIUM CARBONATE ANTACID 500 MG PO CHEW: 2.0000 | CHEWABLE_TABLET | ORAL | Status: DC | PRN

## 2017-01-03 MED ORDER — ZOLPIDEM TARTRATE 5 MG PO TABS: 5.0000 mg | ORAL_TABLET | Freq: Every evening | ORAL | Status: DC | PRN

## 2017-01-03 NOTE — Discharge Instructions (Signed)

## 2017-01-03 NOTE — H&P (Signed)
Lisa Faulkner is a G83P0  21 y.o. female presenting for leaking water and bleeding since this morning.   Has some cramping in lower abdomen. Pregnancy has been unremarkable thus far.  Patient Active Problem List   Diagnosis Date Noted  . Morbid obesity (HCC) 12/19/2016  . Supervision of other normal pregnancy, antepartum 12/17/2016  . Constipation 12/17/2016  . Nausea and vomiting of pregnancy, antepartum 12/17/2016    RN Note: When came down stairs, water started coming out, continuing to come out, now has also started bleeding and cramping. Denies recent intercourse . OB History    Gravida Para Term Preterm AB Living   1             SAB TAB Ectopic Multiple Live Births                 Past Medical History:  Diagnosis Date  . Medical history non-contributory   . PCOS (polycystic ovarian syndrome)    Past Surgical History:  Procedure Laterality Date  . KNEE SURGERY    . WISDOM TOOTH EXTRACTION     Family History: family history includes Heart disease in her brother; Hypertension in her brother, father, and mother; Stroke (age of onset: 29) in her brother. Social History:  reports that she has never smoked. She has never used smokeless tobacco. She reports that she does not drink alcohol or use drugs.     Maternal Diabetes: No Genetic Screening: Normal  Results pending Maternal Ultrasounds/Referrals: Has not had anatomy US yet.  Bedside US showed no fluid and thickened placenta, consistent PPROM and possible abruption Fetal Ultrasounds or other Referrals:  None Maternal Substance Abuse:  No Significant Maternal Medications:  None Significant Maternal Lab Results:  None Other Comments:  PPROM  Review of Systems  Constitutional: Negative for chills, fever and malaise/fatigue.  Eyes: Negative for blurred vision.  Respiratory: Negative for shortness of breath.   Cardiovascular: Negative for leg swelling.  Gastrointestinal: Positive for abdominal pain. Negative for  constipation, diarrhea, nausea and vomiting.  Genitourinary: Negative for dysuria.  Musculoskeletal: Negative for myalgias.  Neurological: Negative for dizziness and focal weakness.   Maternal Medical History:  Reason for admission: Rupture of membranes.  Nausea.  Contractions: Onset was 1-2 hours ago.   Frequency: irregular.   Perceived severity is mild.    Fetal activity: Perceived fetal activity is none.    Prenatal complications: Bleeding.   No PIH, infection, pre-eclampsia or preterm labor.   Prenatal Complications - Diabetes: none.      Blood pressure (!) 141/87, pulse (!) 109, temperature 98.6 F (37 C), resp. rate 18, weight 262 lb 4 oz (119 kg), last menstrual period 09/13/2016, SpO2 98 %. Maternal Exam:  Uterine Assessment: Contraction strength is mild.  Contraction frequency is irregular.   Abdomen: Patient reports generalized tenderness.  Patient reports the following abdominal tenderness: periumbilical and suprapubic.  Fundal height is 16.   Fetal presentation: breech  Introitus: Normal vulva. Vagina is positive for vaginal discharge (watery and bloody, port wine color.  + ferning,  Cervix closed).  Ferning test: positive.  Nitrazine test: not done. Amniotic fluid character: bloody.  Pelvis: adequate for delivery.   Cervix: Cervix evaluated by sterile speculum exam.     Fetal Exam Fetal Monitor Review: Mode: ultrasound.   Baseline rate: 150.      Physical Exam  Constitutional: She is oriented to person, place, and time. She appears well-developed and well-nourished. No distress (but tearful).  HENT:  Head: Normocephalic.  Cardiovascular: Normal rate and regular rhythm.   Respiratory: Effort normal. No respiratory distress.  GI: Soft. She exhibits no distension and no mass. There is generalized tenderness and tenderness in the periumbilical area and suprapubic area. There is guarding. There is no rebound.  Genitourinary: Vaginal discharge (watery and  bloody, port wine color.  + ferning,  Cervix closed) found.  Musculoskeletal: Normal range of motion.  Neurological: She is alert and oriented to person, place, and time.  Skin: Skin is warm and dry.  Psychiatric: She has a normal mood and affect.    Prenatal labs: ABO, Rh: O/Positive/-- (05/15 1005) Antibody: Negative (05/15 1005) Rubella: 1.05 (05/15 1005) RPR: Non Reactive (05/15 1005)  HBsAg: Negative (05/15 1005)  HIV: Non Reactive (05/15 1005)  GBS:     Assessment/Plan: Single IUP at 7719w0d Preterm premature rupture of membranes Port wine fluid, and US suggestive of placental abruption  Admit to Perinatal unit per Dr Alysia PennaErvin Routine orders Induction of labor   Lisa BourgeoisMarie Faulkner 01/03/2017, 3:16 PM  Pt seen and examined. Inductions for IOL due to SROM at 16 weeks reviewed with pt. Delivery method reviewed with pt and mother. Potential need for D & E reviewed as well. Pt verbalized understanding. Will begin vaginal Cytotec upon arrival to the floor.

## 2017-01-03 NOTE — Progress Notes (Signed)
Faculty practice called regarding pt's temp of 99. Assured by MD this was normal post her 600 of cytotec. Reported clot of ping pong ball size with first up and void, again normal. Will monitor and reassure pt. Bleeding subsided with 1st void and uterus u/1, midline and firm

## 2017-01-03 NOTE — MAU Note (Signed)
Lisa BourgeoisMarie Williams, CNM in with pt discussing plan of care. PT verbalizes understanding. Questions encouraged and answered

## 2017-01-03 NOTE — MAU Note (Signed)
When came down stairs, water started coming out, continuing to come out, now has also started bleeding and cramping. Denies recent intercourse

## 2017-01-03 NOTE — Progress Notes (Signed)
1835 Pt delivered a 16 week fetus into the hat of the toilet. Placenta followed at 1838 Infant had detectable heart rate until 1843. Mother and Grandmother held fetus. Small amount of bleeding at the 15 minute and 30 minute checks with a couple small clots. Placenta sent to pathology as ordered. Hand and foot prints done at patients request.

## 2017-01-03 NOTE — Discharge Summary (Signed)
Obstetric Discharge Summary Reason for Admission: spontaneous abortion Prenatal Procedures: none Intrapartum Procedures: spontaneous vaginal delivery Postpartum Procedures: none Complications-Operative and Postpartum: none Hemoglobin  Date Value Ref Range Status  01/03/2017 10.9 (L) 12.0 - 15.0 g/dL Final   HCT  Date Value Ref Range Status  01/03/2017 31.1 (L) 36.0 - 46.0 % Final   Hematocrit  Date Value Ref Range Status  12/17/2016 33.5 (L) 34.0 - 46.6 % Final    Physical Exam:  Lungs clear Heart RRR Abd soft + BS uterus firm min bleeding Ext non tedner  Discharge Diagnoses: Spontaneous abortion  Discharge Information: Date: 01/03/2017 Activity: pelvic rest Diet: routine Medications: Ibuprofen Condition: stable Instructions: refer to practice specific booklet Discharge to: home Follow-up Information    [facility]. Schedule an appointment as soon as possible for a visit in 4 week(s).   Specialty:  Obstetrics and Gynecology Contact information: [address] [place] [id number] [phone]          Newborn Data: Non viable female infant at [id number].  Lisa StaggersMichael L Dai Faulkner 01/03/2017, 7:10 PM

## 2017-01-04 ENCOUNTER — Encounter (HOSPITAL_COMMUNITY): Payer: Self-pay

## 2017-01-04 NOTE — Progress Notes (Signed)
Discharge instructions given to patient and patient's mother. Pt verbalized understanding and has no questions or concerns at this time. Pt being discharged to home.

## 2017-01-06 ENCOUNTER — Telehealth: Payer: Self-pay | Admitting: *Deleted

## 2017-01-06 NOTE — Telephone Encounter (Signed)
-----   Message from Hermina StaggersMichael L Ervin, MD sent at 01/03/2017  7:12 PM EDT ----- Pt had SAB at 16 weeks d/t SROM on January 03 2017. Please check on pt and schedule follow up appt in 4 weeks. Thanks Casimiro NeedleMichael

## 2017-01-06 NOTE — Telephone Encounter (Signed)
Called pt, no answer, unable to leave a message due to her mailbox being full.

## 2017-01-07 ENCOUNTER — Telehealth: Payer: Self-pay | Admitting: *Deleted

## 2017-01-07 DIAGNOSIS — M549 Dorsalgia, unspecified: Secondary | ICD-10-CM

## 2017-01-07 MED ORDER — CYCLOBENZAPRINE HCL 10 MG PO TABS: 10.0000 mg | ORAL_TABLET | Freq: Three times a day (TID) | ORAL | 0 refills | Status: DC | PRN

## 2017-01-07 NOTE — Telephone Encounter (Signed)
-----   Message from Michael L Ervin, MD sent at 01/03/2017  7:12 PM EDT ----- Pt had SAB at 16 weeks d/t SROM on January 03 2017. Please check on pt and schedule follow up appt in 4 weeks. Thanks Michael  

## 2017-01-07 NOTE — Telephone Encounter (Signed)
Spoke to pt, states she is doing as expected but is continuing to have a lot of lower back discomfort even after taking Ibuprofen.  Sent Flexeril to pharmacy per Dr Shawnie PonsPratt order and scheduled follow-up on 02-04-17.

## 2017-01-12 ENCOUNTER — Encounter (HOSPITAL_COMMUNITY): Payer: Self-pay

## 2017-01-22 ENCOUNTER — Ambulatory Visit (HOSPITAL_COMMUNITY): Payer: BLUE CROSS/BLUE SHIELD

## 2017-01-28 ENCOUNTER — Encounter: Payer: BLUE CROSS/BLUE SHIELD | Admitting: Family Medicine

## 2017-02-04 ENCOUNTER — Encounter: Payer: Self-pay | Admitting: Family Medicine

## 2017-02-04 ENCOUNTER — Ambulatory Visit (INDEPENDENT_AMBULATORY_CARE_PROVIDER_SITE_OTHER): Payer: BLUE CROSS/BLUE SHIELD | Admitting: Family Medicine

## 2017-02-04 DIAGNOSIS — O42912 Preterm premature rupture of membranes, unspecified as to length of time between rupture and onset of labor, second trimester: Secondary | ICD-10-CM

## 2017-02-04 DIAGNOSIS — O42919 Preterm premature rupture of membranes, unspecified as to length of time between rupture and onset of labor, unspecified trimester: Secondary | ICD-10-CM

## 2017-02-04 NOTE — Progress Notes (Signed)
Pt here for follow up 16wk PPROM

## 2017-02-04 NOTE — Assessment & Plan Note (Signed)
Discussed possible cerclage placement with next pregnancy.

## 2017-02-04 NOTE — Progress Notes (Signed)
    Subjective:    Patient ID: Lisa Faulkner is a 21 y.o. female presenting with Follow-up  on 02/04/2017  HPI: Here today for f/u after PPROM at 16 wks. Her mood is good. She arrives with her mother who has questions about role of PCOS in this. She is not having sex and is not interested in contraception. She does not want to be pregnant right now. She is still having some spotting. No bleeding as yet.  Review of Systems  Constitutional: Negative for chills and fever.  Respiratory: Negative for shortness of breath.   Cardiovascular: Negative for chest pain.  Gastrointestinal: Negative for abdominal pain, nausea and vomiting.  Genitourinary: Negative for dysuria.  Skin: Negative for rash.      Objective:    BP 124/81   Pulse 85   Ht 5\' 6"  (1.676 m)   Wt 279 lb (126.6 kg)   Breastfeeding? No   BMI 45.03 kg/m  Physical Exam  Constitutional: She is oriented to person, place, and time. She appears well-developed and well-nourished. No distress.  HENT:  Head: Normocephalic and atraumatic.  Eyes: No scleral icterus.  Neck: Neck supple.  Cardiovascular: Normal rate.   Pulmonary/Chest: Effort normal.  Abdominal: Soft.  Neurological: She is alert and oriented to person, place, and time.  Skin: Skin is warm and dry.  Psychiatric: She has a normal mood and affect.        Assessment & Plan:   Problem List Items Addressed This Visit      Unprioritized   Preterm premature rupture of membranes (PPROM) with unknown onset of labor    Discussed possible cerclage placement with next pregnancy.         Total face-to-face time with patient: 15 minutes. Over 50% of encounter was spent on counseling and coordination of care. Return if symptoms worsen or fail to improve.  Reva Boresanya S Santiaga Butzin 02/04/2017 3:07 PM

## 2017-02-04 NOTE — Patient Instructions (Signed)
Polycystic Ovarian Syndrome Polycystic ovarian syndrome (PCOS) is a common hormonal disorder among women of reproductive age. In most women with PCOS, many small fluid-filled sacs (cysts) grow on the ovaries, and the cysts are not part of a normal menstrual cycle. PCOS can cause problems with your menstrual periods and make it difficult to get pregnant. It can also cause an increased risk of miscarriage with pregnancy. If it is not treated, PCOS can lead to serious health problems, such as diabetes and heart disease. What are the causes? The cause of PCOS is not known, but it may be the result of a combination of certain factors, such as:  Irregular menstrual cycle.  High levels of certain hormones (androgens).  Problems with the hormone that helps to control blood sugar (insulin resistance).  Certain genes.  What increases the risk? This condition is more likely to develop in women who have a family history of PCOS. What are the signs or symptoms? Symptoms of PCOS may include:  Multiple ovarian cysts.  Infrequent periods or no periods.  Periods that are too frequent or too heavy.  Unpredictable periods.  Inability to get pregnant (infertility) because of not ovulating.  Increased growth of hair on the face, chest, stomach, back, thumbs, thighs, or toes.  Acne or oily skin. Acne may develop during adulthood, and it may not respond to treatment.  Pelvic pain.  Weight gain or obesity.  Patches of thickened and dark brown or black skin on the neck, arms, breasts, or thighs (acanthosis nigricans).  Excess hair growth on the face, chest, abdomen, or upper thighs (hirsutism).  How is this diagnosed? This condition is diagnosed based on:  Your medical history.  A physical exam, including a pelvic exam. Your health care provider may look for areas of increased hair growth on your skin.  Tests, such as: ? Ultrasound. This may be used to examine the ovaries and the lining of the  uterus (endometrium) for cysts. ? Blood tests. These may be used to check levels of sugar (glucose), female hormone (testosterone), and female hormones (estrogen and progesterone) in your blood.  How is this treated? There is no cure for PCOS, but treatment can help to manage symptoms and prevent more health problems from developing. Treatment varies depending on:  Your symptoms.  Whether you want to have a baby or whether you need birth control (contraception).  Treatment may include nutrition and lifestyle changes along with:  Progesterone hormone to start a menstrual period.  Birth control pills to help you have regular menstrual periods.  Medicines to make you ovulate, if you want to get pregnant.  Medicine to reduce excessive hair growth.  Surgery, in severe cases. This may involve making small holes in one or both of your ovaries. This decreases the amount of testosterone that your body produces.  Follow these instructions at home:  Take over-the-counter and prescription medicines only as told by your health care provider.  Follow a healthy meal plan. This can help you reduce the effects of PCOS. ? Eat a healthy diet that includes lean proteins, complex carbohydrates, fresh fruits and vegetables, low-fat dairy products, and healthy fats. Make sure to eat enough fiber.  If you are overweight, lose weight as told by your health care provider. ? Losing 10% of your body weight may improve symptoms. ? Your health care provider can determine how much weight loss is best for you and can help you lose weight safely.  Keep all follow-up visits as told by   your health care provider. This is important. Contact a health care provider if:  Your symptoms do not get better with medicine.  You develop new symptoms. This information is not intended to replace advice given to you by your health care provider. Make sure you discuss any questions you have with your health care  provider. Document Released: 11/15/2004 Document Revised: 03/19/2016 Document Reviewed: 01/07/2016 Elsevier Interactive Patient Education  2018 [employer] Cervical Insufficiency Cervical insufficiency is when the cervix is weak and starts to open (dilate) and thin (efface) before the pregnancy is at term and before labor starts. This is also called incompetent cervix. It can happen during the second or third trimester when the fetus starts putting pressure on the cervix. Treatment may reduce the risk of problems for you and your baby. Cervical insufficiency can lead to:  Loss of the baby (miscarriage).  Breaking of the sac that holds the baby (amniotic sac). This is also called preterm premature rupture of the membranes,PPROM.  The baby being born early (preterm birth).  What are the causes? The cause of this condition is not well known. However, it may be caused by abnormalities in the cervix and other factors such as inflammation or infection. What increases the risk? This condition is more likely to develop if:  You have a shorter cervix than normal.  Your cervix was damaged or injured during a past pregnancy or surgery.  You were born with a cervical defect.  You have had a procedure done on the cervix, such as cervical biopsy.  You have a history of: ? Cervical insufficiency. ? PPROM.  You have ended several pregnancies through abortion.  You were exposed to the drug diethylstilbestrol (DES).  What are the signs or symptoms? Symptoms of this condition can vary. Sometimes there no symptoms for this condition, and at other times there are mild symptoms that start between weeks 14 and 20 of pregnancy. The symptoms may last several days or weeks. These symptoms include:  Light spotting or bleeding from the vagina.  Pelvic pressure.  A change in vaginal discharge, such as changes from clear, white, or light yellow to pink or tan.  Back pain.  Abdominal pain or  cramping.  How is this diagnosed? This condition may be diagnosed based on:  Your symptoms.  Your medical history, including: ? Any problems during past pregnancies, such as miscarriages. ? Any procedures performed on your cervix. ? Any history of cervical insufficiency.  During the second trimester, cervical insufficiency may be diagnosed based on:  An ultrasound done with a probe inserted into your vagina (transvaginal ultrasound).  A pelvic exam.  Tests of fluid in the amniotic sac. This is done to rule out infection.  How is this treated? This condition may be managed by:  Limiting physical activity.  Limiting activity at home or in the hospital.  Pelvic rest. This means that there should be no sexual intercourse or placing anything in the vagina.  A procedure to sew the cervix closed and prevent it from opening too early (cerclage). The stitches (sutures) are removed between weeks 36 and 38 to avoid problems during labor. Cerclage may be recommended if: ? You have a history of miscarriages or preterm births without a known cause. ? You have a short cervix. A short cervix is identified by ultrasound. ? Your cervix has dilated before 24 weeks of pregnancy.  Follow these instructions at home:  Get plenty of rest and lessen activity as told by your health  care provider. Ask your health care provider what activities are safe for you.  If pelvic rest was recommended, you shouldnot have sex, use tampons, douche, or place anything inside your vagina until your health care provider says that this is okay.  Take over-the-counter and prescription medicines only as told by your health care provider.  Keep all follow-up visits and prenatal visits as told by your health care provider. This is important. Get help right away if:  You have vaginal bleeding, even if it is a small amount or even if it is painless.  You have pain in your abdomen or your lower back.  You have a  feeling of increased pressure in your pelvis.  You have vaginal discharge that changes from clear, white, or light yellow to pink or tan.  You have a fever.  You have severe nausea or vomiting. Summary  Cervical insufficiency is when the cervix is weak and starts to dilate and efface before the pregnancy is at term and before labor starts.  Symptoms of this condition can vary from no symptoms to mild symptoms that start between weeks 14 and 20 of pregnancy. The symptoms may last several days or weeks.  This condition may be managed by limiting physical activity, having pelvic rest, or having cervical cerclage.  If pelvic rest was recommended, you should not have sex, use tampons, use a douche, or place anything inside your vagina until your health care provider says that this is okay. This information is not intended to replace advice given to you by your health care provider. Make sure you discuss any questions you have with your health care provider. Document Released: 07/22/2005 Document Revised: 07/25/2016 Document Reviewed: 07/25/2016 Elsevier Interactive Patient Education  2017 [employer].

## 2017-09-04 ENCOUNTER — Ambulatory Visit: Payer: BLUE CROSS/BLUE SHIELD | Admitting: Obstetrics and Gynecology

## 2017-09-09 ENCOUNTER — Encounter: Payer: Self-pay | Admitting: Obstetrics and Gynecology

## 2017-09-09 ENCOUNTER — Encounter: Payer: Self-pay | Admitting: Radiology

## 2017-09-09 ENCOUNTER — Ambulatory Visit: Payer: BLUE CROSS/BLUE SHIELD | Admitting: Obstetrics and Gynecology

## 2017-09-09 VITALS — BP 143/82 | HR 82 | Ht 65.0 in | Wt 296.0 lb

## 2017-09-09 DIAGNOSIS — R030 Elevated blood-pressure reading, without diagnosis of hypertension: Secondary | ICD-10-CM

## 2017-09-09 DIAGNOSIS — N898 Other specified noninflammatory disorders of vagina: Secondary | ICD-10-CM | POA: Diagnosis not present

## 2017-09-09 DIAGNOSIS — E282 Polycystic ovarian syndrome: Secondary | ICD-10-CM | POA: Insufficient documentation

## 2017-09-09 DIAGNOSIS — Z113 Encounter for screening for infections with a predominantly sexual mode of transmission: Secondary | ICD-10-CM

## 2017-09-09 DIAGNOSIS — Z6841 Body Mass Index (BMI) 40.0 and over, adult: Secondary | ICD-10-CM | POA: Insufficient documentation

## 2017-09-09 DIAGNOSIS — Z8742 Personal history of other diseases of the female genital tract: Secondary | ICD-10-CM

## 2017-09-09 NOTE — Progress Notes (Signed)
Pt states during last cycle she bled for 2 weeks. She also has abnormal vaginal discharge.

## 2017-09-09 NOTE — Progress Notes (Signed)
Obstetrics and Gynecology Established Patient Evaluation  Appointment Date: 09/09/2017  [facility]  Primary Care Provider: System, Pcp Not In  Chief Complaint: AUB, vaginal discharge  History of Present Illness: Lisa Faulkner is a 22 y.o. African-American G1P1 (Patient's last menstrual period was 08/18/2017.), seen for the above chief complaint. Her past medical history is significant for BMI 40s, ?PCOS   Had two weeks of bleeding after her last period which never happened before. It was BRB and not painful and somewhat heavy.   Also two days of vaginal discharge. No prior h/o similar s/s and no vaginal itching or smell.   Review of Systems:  as noted in the History of Present Illness.  Patient Active Problem List   Diagnosis Date Noted  . BMI 40.0-44.9, adult (HCC)   . PCOS (polycystic ovarian syndrome)     Past Medical History:  Past Medical History:  Diagnosis Date  . BMI 40.0-44.9, adult (HCC)   . History of preterm premature rupture of membranes (PPROM)    @ 16wks. G1 pregnancy  . PCOS (polycystic ovarian syndrome)     Past Surgical History:  Past Surgical History:  Procedure Laterality Date  . KNEE SURGERY    . WISDOM TOOTH EXTRACTION      Past Obstetrical History:  OB History  Gravida Para Term Preterm AB Living  1 1       1   SAB TAB Ectopic Multiple Live Births        0 1    # Outcome Date GA Lbr Len/2nd Weight Sex Delivery Anes PTL Lv  1 Para 01/03/17 [id number] 01:29  M Vag-Spont None  LIV      Past Gynecological History: As per HPI. Periods: patient states her period are qmonth, regular, 4d and not heavy or painful History of Pap Smear(s): Yes.   Last pap 2018, which was NILM She is currently using nothing for contraception.   Social History:  Social History   Socioeconomic History  . Marital status: Single    Spouse name: Not on file  . Number of children: Not on file  . Years of education: Not  on file  . Highest education level: Not on file  Social Needs  . Financial resource strain: Not on file  . Food insecurity - worry: Not on file  . Food insecurity - inability: Not on file  . Transportation needs - medical: Not on file  . Transportation needs - non-medical: Not on file  Occupational History  . Not on file  Tobacco Use  . Smoking status: Never Smoker  . Smokeless tobacco: Never Used  Substance and Sexual Activity  . Alcohol use: No  . Drug use: No  . Sexual activity: Yes    Partners: Male  Other Topics Concern  . Not on file  Social History Narrative  . Not on file    Family History:  Family History  Problem Relation Age of Onset  . Hypertension Mother   . Hypertension Father   . Hypertension Brother   . Heart disease Brother   . Stroke Brother 29     Medications Lisa Faulkner had no medications administered during this visit. No current outpatient medications on file.   No current facility-administered medications for this visit.     Allergies Patient has no known allergies.   Physical Exam:  BP (!) 143/82   Pulse 82   Ht 5\' 5"  (1.651 m)   Wt 296 lb (134.3 kg)  LMP 08/18/2017   Breastfeeding? No   BMI 49.26 kg/m  Body mass index is 49.26 kg/m. General appearance: Well nourished, well developed female in no acute distress.  Respiratory:  Normal respiratory effort Neuro/Psych:  Normal mood and affect.  Skin:  Warm and dry.  Lymphatic:  No inguinal lymphadenopathy.   Pelvic exam: is not limited by body habitus EGBUS: within normal limits, Vagina: within normal limits and with no blood or discharge in the vault, Cervix: normal appearing cervix without tenderness, discharge or lesions. Uterus:  nonenlarged and non tender and Adnexa:  normal adnexa and no mass, fullness, tenderness Rectovaginal: deferred  Laboratory: none  Radiology: none  Assessment: pt stable  Plan:  1. History of abnormal uterine bleeding I told her it's not  uncommon to get an odd period like that. She didn't take a UPT but I told her if that happens again to take a UPT as could be a miscarriage. I told her if it happens again and we can d/w her re: work up or just presumptively starting her on something but I'd just keep an eye on it for now which she is amenable to.  Pt is sexually active but doesn't want to be on anything for [facility]   2. Transient hypertension 1wk bp  3. Vaginal discharge Pt amenable to swab testing.    RTC PRN  Cornelia Copa MD Attending [facility] [employer])

## 2017-09-10 LAB — CERVICOVAGINAL ANCILLARY ONLY
Bacterial vaginitis: POSITIVE — AB
Candida vaginitis: POSITIVE — AB
Chlamydia: NEGATIVE
Neisseria Gonorrhea: NEGATIVE
Trichomonas: POSITIVE — AB

## 2017-09-11 ENCOUNTER — Other Ambulatory Visit: Payer: Self-pay | Admitting: Obstetrics and Gynecology

## 2017-09-11 ENCOUNTER — Encounter: Payer: Self-pay | Admitting: Obstetrics and Gynecology

## 2017-09-11 DIAGNOSIS — A599 Trichomoniasis, unspecified: Secondary | ICD-10-CM | POA: Insufficient documentation

## 2017-09-11 MED ORDER — FLUCONAZOLE 150 MG PO TABS: 150.0000 mg | ORAL_TABLET | Freq: Once | ORAL | 0 refills | Status: AC

## 2017-09-11 MED ORDER — METRONIDAZOLE 500 MG PO TABS: 500.0000 mg | ORAL_TABLET | Freq: Two times a day (BID) | ORAL | 0 refills | Status: AC

## 2017-09-16 ENCOUNTER — Encounter: Payer: Self-pay | Admitting: Radiology

## 2017-09-16 ENCOUNTER — Ambulatory Visit: Payer: BLUE CROSS/BLUE SHIELD

## 2017-09-16 VITALS — BP 146/96 | HR 100

## 2017-09-16 DIAGNOSIS — R030 Elevated blood-pressure reading, without diagnosis of hypertension: Secondary | ICD-10-CM

## 2017-09-16 NOTE — Progress Notes (Signed)
Subjective:  Lisa BattyKie'shawn Faulkner is a 22 y.o. female with transient hypertension.   Hypertension ROS: taking medications as instructed, no medication side effects noted, no TIA's, no chest pain on exertion, no dyspnea on exertion and no swelling of ankles.  New concerns:    Objective:  LMP 08/18/2017   Appearance alert, well appearing, and in no distress. General exam BP noted to be well controlled today in office.    Assessment:   Hypertension needs further observation.    Plan: Follow up as directed

## 2017-09-16 NOTE — Addendum Note (Signed)
Addended by: Reva BoresPRATT, Dajanee Voorheis S on: 09/16/2017 05:22 PM   Modules accepted: Level of Service

## 2017-09-16 NOTE — Progress Notes (Signed)
Patient seen and assessed by nursing staff.  Agree with documentation and plan. I have discussed with the nurse, advising DASH diet, weight loss and PCP referral.

## 2017-09-16 NOTE — Patient Instructions (Signed)
DASH Eating Plan DASH stands for "Dietary Approaches to Stop Hypertension." The DASH eating plan is a healthy eating plan that has been shown to reduce high blood pressure (hypertension). It may also reduce your risk for type 2 diabetes, heart disease, and stroke. The DASH eating plan may also help with weight loss. What are tips for following this plan? General guidelines  Avoid eating more than 2,300 mg (milligrams) of salt (sodium) a day. If you have hypertension, you may need to reduce your sodium intake to 1,500 mg a day.  Limit alcohol intake to no more than 1 drink a day for nonpregnant women and 2 drinks a day for men. One drink equals 12 oz of beer, 5 oz of wine, or 1 oz of hard liquor.  Work with your health care provider to maintain a healthy body weight or to lose weight. Ask what an ideal weight is for you.  Get at least 30 minutes of exercise that causes your heart to beat faster (aerobic exercise) most days of the week. Activities may include walking, swimming, or biking.  Work with your health care provider or diet and nutrition specialist (dietitian) to adjust your eating plan to your individual calorie needs. Reading food labels  Check food labels for the amount of sodium per serving. Choose foods with less than 5 percent of the Daily Value of sodium. Generally, foods with less than 300 mg of sodium per serving fit into this eating plan.  To find whole grains, look for the word "whole" as the first word in the ingredient list. Shopping  Buy products labeled as "low-sodium" or "no salt added."  Buy fresh foods. Avoid canned foods and premade or frozen meals. Cooking  Avoid adding salt when cooking. Use salt-free seasonings or herbs instead of table salt or sea salt. Check with your health care provider or pharmacist before using salt substitutes.  Do not fry foods. Cook foods using healthy methods such as baking, boiling, grilling, and broiling instead.  Cook with  heart-healthy oils, such as olive, canola, soybean, or sunflower oil. Meal planning   Eat a balanced diet that includes: ? 5 or more servings of fruits and vegetables each day. At each meal, try to fill half of your plate with fruits and vegetables. ? Up to 6-8 servings of whole grains each day. ? Less than 6 oz of lean meat, poultry, or fish each day. A 3-oz serving of meat is about the same size as a deck of cards. One egg equals 1 oz. ? 2 servings of low-fat dairy each day. ? A serving of nuts, seeds, or beans 5 times each week. ? Heart-healthy fats. Healthy fats called Omega-3 fatty acids are found in foods such as flaxseeds and coldwater fish, like sardines, salmon, and mackerel.  Limit how much you eat of the following: ? Canned or prepackaged foods. ? Food that is high in trans fat, such as fried foods. ? Food that is high in saturated fat, such as fatty meat. ? Sweets, desserts, sugary drinks, and other foods with added sugar. ? Full-fat dairy products.  Do not salt foods before eating.  Try to eat at least 2 vegetarian meals each week.  Eat more home-cooked food and less restaurant, buffet, and fast food.  When eating at a restaurant, ask that your food be prepared with less salt or no salt, if possible. What foods are recommended? The items listed may not be a complete list. Talk with your dietitian about what   dietary choices are best for you. Grains Whole-grain or whole-wheat bread. Whole-grain or whole-wheat pasta. Brown rice. Oatmeal. Quinoa. Bulgur. Whole-grain and low-sodium cereals. Pita bread. Low-fat, low-sodium crackers. Whole-wheat flour tortillas. Vegetables Fresh or frozen vegetables (raw, steamed, roasted, or grilled). Low-sodium or reduced-sodium tomato and vegetable juice. Low-sodium or reduced-sodium tomato sauce and tomato paste. Low-sodium or reduced-sodium canned vegetables. Fruits All fresh, dried, or frozen fruit. Canned fruit in natural juice (without  added sugar). Meat and other protein foods Skinless chicken or turkey. Ground chicken or turkey. Pork with fat trimmed off. Fish and seafood. Egg whites. Dried beans, peas, or lentils. Unsalted nuts, nut butters, and seeds. Unsalted canned beans. Lean cuts of beef with fat trimmed off. Low-sodium, lean deli meat. Dairy Low-fat (1%) or fat-free (skim) milk. Fat-free, low-fat, or reduced-fat cheeses. Nonfat, low-sodium ricotta or cottage cheese. Low-fat or nonfat yogurt. Low-fat, low-sodium cheese. Fats and oils Soft margarine without trans fats. Vegetable oil. Low-fat, reduced-fat, or light mayonnaise and salad dressings (reduced-sodium). Canola, safflower, olive, soybean, and sunflower oils. Avocado. Seasoning and other foods Herbs. Spices. Seasoning mixes without salt. Unsalted popcorn and pretzels. Fat-free sweets. What foods are not recommended? The items listed may not be a complete list. Talk with your dietitian about what dietary choices are best for you. Grains Baked goods made with fat, such as croissants, muffins, or some breads. Dry pasta or rice meal packs. Vegetables Creamed or fried vegetables. Vegetables in a cheese sauce. Regular canned vegetables (not low-sodium or reduced-sodium). Regular canned tomato sauce and paste (not low-sodium or reduced-sodium). Regular tomato and vegetable juice (not low-sodium or reduced-sodium). Pickles. Olives. Fruits Canned fruit in a light or heavy syrup. Fried fruit. Fruit in cream or butter sauce. Meat and other protein foods Fatty cuts of meat. Ribs. Fried meat. Bacon. Sausage. Bologna and other processed lunch meats. Salami. Fatback. Hotdogs. Bratwurst. Salted nuts and seeds. Canned beans with added salt. Canned or smoked fish. Whole eggs or egg yolks. Chicken or turkey with skin. Dairy Whole or 2% milk, cream, and half-and-half. Whole or full-fat cream cheese. Whole-fat or sweetened yogurt. Full-fat cheese. Nondairy creamers. Whipped toppings.  Processed cheese and cheese spreads. Fats and oils Butter. Stick margarine. Lard. Shortening. Ghee. Bacon fat. Tropical oils, such as coconut, palm kernel, or palm oil. Seasoning and other foods Salted popcorn and pretzels. Onion salt, garlic salt, seasoned salt, table salt, and sea salt. Worcestershire sauce. Tartar sauce. Barbecue sauce. Teriyaki sauce. Soy sauce, including reduced-sodium. Steak sauce. Canned and packaged gravies. Fish sauce. Oyster sauce. Cocktail sauce. Horseradish that you find on the shelf. Ketchup. Mustard. Meat flavorings and tenderizers. Bouillon cubes. Hot sauce and Tabasco sauce. Premade or packaged marinades. Premade or packaged taco seasonings. Relishes. Regular salad dressings. Where to find more information:  National Heart, Lung, and Blood Institute: www.nhlbi.nih.gov  American Heart Association: www.heart.org Summary  The DASH eating plan is a healthy eating plan that has been shown to reduce high blood pressure (hypertension). It may also reduce your risk for type 2 diabetes, heart disease, and stroke.  With the DASH eating plan, you should limit salt (sodium) intake to 2,300 mg a day. If you have hypertension, you may need to reduce your sodium intake to 1,500 mg a day.  When on the DASH eating plan, aim to eat more fresh fruits and vegetables, whole grains, lean proteins, low-fat dairy, and heart-healthy fats.  Work with your health care provider or diet and nutrition specialist (dietitian) to adjust your eating plan to your individual   calorie needs. This information is not intended to replace advice given to you by your health care provider. Make sure you discuss any questions you have with your health care provider. Document Released: 07/11/2011 Document Revised: 07/15/2016 Document Reviewed: 07/15/2016 Elsevier Interactive Patient Education  2018 Elsevier Inc.  

## 2018-02-26 ENCOUNTER — Encounter: Payer: Self-pay | Admitting: Internal Medicine

## 2018-03-02 ENCOUNTER — Ambulatory Visit: Payer: BLUE CROSS/BLUE SHIELD | Admitting: Internal Medicine

## 2018-03-02 ENCOUNTER — Telehealth: Payer: Self-pay | Admitting: Emergency Medicine

## 2018-03-02 ENCOUNTER — Other Ambulatory Visit: Payer: Self-pay | Admitting: Internal Medicine

## 2018-03-02 ENCOUNTER — Encounter: Payer: Self-pay | Admitting: Internal Medicine

## 2018-03-02 VITALS — BP 118/72 | HR 80 | Ht 66.0 in | Wt 313.0 lb

## 2018-03-02 DIAGNOSIS — R110 Nausea: Secondary | ICD-10-CM

## 2018-03-02 DIAGNOSIS — R1084 Generalized abdominal pain: Secondary | ICD-10-CM | POA: Diagnosis not present

## 2018-03-02 DIAGNOSIS — R194 Change in bowel habit: Secondary | ICD-10-CM | POA: Diagnosis not present

## 2018-03-02 DIAGNOSIS — B369 Superficial mycosis, unspecified: Secondary | ICD-10-CM

## 2018-03-02 MED ORDER — OMEPRAZOLE 40 MG PO CPDR: 40.0000 mg | DELAYED_RELEASE_CAPSULE | Freq: Every day | ORAL | 3 refills | Status: DC

## 2018-03-02 NOTE — Telephone Encounter (Signed)
Dr. Marina GoodellPerry,   Received message from pharmacy that esomeprazole 40 mg is preferred instead of omeprazole by her insurance. Ok to fill?

## 2018-03-02 NOTE — Progress Notes (Signed)
HISTORY OF PRESENT ILLNESS:  Lisa Faulkner is a 22 y.o. female , [place] native currently in beauty school,with morbid obesity presents today with chief complaints of abdominal pain, alternating bowel habits, bloating, and foul-smelling discharge from the umbilicus. She is accompanied by her mother. Patient reports long-standing problems with abdominal complaints. She describes postprandial epigastric discomfort with nausea. No dysphagia. Some indigestion and heartburn. Progressive weight gain. She also describes alternating bowel habits with significant diarrhea approximately once per week. No incontinence. No bleeding. She cannot describe any exacerbating or relieving factors. She has been contemplating bariatric surgery. Inquire about a referral. She does not use alcohol. She does smoke marijuana  REVIEW OF SYSTEMS:  All non-GI ROS negative unless otherwise stated in the history of present illness except for headaches, back pain  Past Medical History:  Diagnosis Date  . BMI 40.0-44.9, adult (HCC)   . History of preterm premature rupture of membranes (PPROM)    @ 16wks. G1 pregnancy  . Hypertension   . PCOS (polycystic ovarian syndrome)   . Prediabetes     Past Surgical History:  Procedure Laterality Date  . KNEE SURGERY    . WISDOM TOOTH EXTRACTION      Social History Lisa Faulkner  reports that she has never smoked. She has never used smokeless tobacco. She reports that she does not drink alcohol or use drugs.  family history includes Diabetes in her maternal grandmother; Heart disease in her brother; Hypertension in her brother, father, and mother; Irritable bowel syndrome in her sister; Stroke in her brother; Stroke (age of onset: 6629) in her brother.  No Known Allergies     PHYSICAL EXAMINATION: Vital signs: BP 118/72   Pulse 80   Ht 5\' 6"  (1.676 m)   Wt (!) 313 lb (142 kg)   LMP 01/31/2018 (Approximate)   BMI 50.52 kg/m   Constitutional: pleasant, obese but  generally well-appearing, no acute distress Psychiatric: alert and oriented x3, cooperative Eyes: extraocular movements intact, anicteric, conjunctiva pink Mouth: oral pharynx moist, no lesions Neck: supple no lymphadenopathy Cardiovascular: heart regular rate and rhythm, no murmur Lungs: clear to auscultation bilaterally Abdomen: soft, obese, nontender, nondistended, no obvious ascites, no peritoneal signs, normal bowel sounds, no organomegaly. Mild cutaneous fungal infection at the umbilicus Rectal:omitted Extremities: no clubbing, cyanosis, or lower extremity edema bilaterally Skin: no lesions on visible extremities. Multiple tattoos noted Neuro: No focal deficits. Cranial nerves intact  ASSESSMENT:  #1. Abdominal pain, generally postprandial. Rule out acid peptic disorders, gallstones, IBS, functional dyspepsia #2. Chronic alternating bowel habits and bloating consistent with irritable bowel #3. Morbid obesity #4. Cutaneous fungal infection at the umbilicus   PLAN:  #1. Prescribe omeprazole 40 mg daily #2. Schedule abdominal ultrasound #3. Initiate Metamucil 2 tablespoons daily to improve bowel consistency #4. Exercise #5. Weight loss #6. Recommended over-the-counter antifungal powder #7. Referral to [facility] for informational seminar #8. Routine GI follow-up 2 months

## 2018-03-02 NOTE — Patient Instructions (Signed)
You have been scheduled for an abdominal ultrasound at [facility] on 03-06-18 at 9:30 am. Please arrive 15 minutes prior to your appointment for registration. Make certain not to have anything to eat or drink after midnight prior to your appointment. Should you need to reschedule your appointment, please contact radiology at [phone]. This test typically takes about 30 minutes to perform.  We have sent the following medications to your pharmacy for you to pick up at your convenience: Omperazole 40 mg daily   Metamucil, 1-2 tablespoon once or twice daily can be used to keep bowels regular if needed.  Continue exercise 3-4 times a week, at least 30 minutes of cardio and weight loss.  [facility] has a bariatric clinic you can call for more information. [phone].

## 2018-03-03 NOTE — Telephone Encounter (Signed)
That would be fine.  Thank you

## 2018-03-03 NOTE — Telephone Encounter (Signed)
rx sent to pharmacy

## 2018-03-06 ENCOUNTER — Ambulatory Visit
Admission: RE | Admit: 2018-03-06 | Discharge: 2018-03-06 | Disposition: A | Discharge status: Home / Self Care | Payer: BLUE CROSS/BLUE SHIELD | Source: Ambulatory Visit | Attending: Internal Medicine | Admitting: Internal Medicine

## 2018-03-06 DIAGNOSIS — R194 Change in bowel habit: Secondary | ICD-10-CM | POA: Diagnosis not present

## 2018-03-06 DIAGNOSIS — R110 Nausea: Secondary | ICD-10-CM | POA: Insufficient documentation

## 2018-03-06 DIAGNOSIS — R1084 Generalized abdominal pain: Secondary | ICD-10-CM | POA: Diagnosis present

## 2018-03-06 DIAGNOSIS — R932 Abnormal findings on diagnostic imaging of liver and biliary tract: Secondary | ICD-10-CM | POA: Diagnosis not present

## 2018-03-25 ENCOUNTER — Other Ambulatory Visit (HOSPITAL_COMMUNITY): Payer: Self-pay | Admitting: Internal Medicine

## 2018-03-25 DIAGNOSIS — R1013 Epigastric pain: Secondary | ICD-10-CM

## 2018-03-31 ENCOUNTER — Ambulatory Visit (HOSPITAL_COMMUNITY): Payer: BLUE CROSS/BLUE SHIELD

## 2018-04-09 ENCOUNTER — Ambulatory Visit (HOSPITAL_COMMUNITY)
Admission: RE | Admit: 2018-04-09 | Discharge: 2018-04-09 | Disposition: A | Discharge status: Home / Self Care | Payer: BLUE CROSS/BLUE SHIELD | Source: Ambulatory Visit | Attending: Internal Medicine | Admitting: Internal Medicine

## 2018-04-09 DIAGNOSIS — R1013 Epigastric pain: Secondary | ICD-10-CM

## 2018-04-20 ENCOUNTER — Ambulatory Visit: Payer: BLUE CROSS/BLUE SHIELD | Admitting: Neurology

## 2018-04-20 ENCOUNTER — Encounter: Payer: Self-pay | Admitting: Neurology

## 2018-04-20 VITALS — BP 154/75 | HR 61 | Ht 66.0 in | Wt 313.0 lb

## 2018-04-20 DIAGNOSIS — R0683 Snoring: Secondary | ICD-10-CM | POA: Diagnosis not present

## 2018-04-20 DIAGNOSIS — G4719 Other hypersomnia: Secondary | ICD-10-CM | POA: Diagnosis not present

## 2018-04-20 DIAGNOSIS — Z6841 Body Mass Index (BMI) 40.0 and over, adult: Secondary | ICD-10-CM

## 2018-04-20 DIAGNOSIS — R351 Nocturia: Secondary | ICD-10-CM | POA: Diagnosis not present

## 2018-04-20 NOTE — Patient Instructions (Signed)

## 2018-04-20 NOTE — Progress Notes (Signed)
Subjective:    Patient ID: Lisa Faulkner is a 22 y.o. female.  HPI     Huston Foley, MD, PhD [facility] [address], [address] [address] [place], [place] [address]  Dear Nettie Elm,   I saw your patient, Lisa Faulkner, upon your kind request in my neurologic clinic today for initial consultation of her sleep disorder, in particular, concern for underlying obstructive sleep apnea. The patient is unaccompanied today. As you know, Ms. Sharlet Salina is a 22 year old right-handed woman with an underlying medical history of irritable bowel syndrome, PCOS, and morbid obesity with BMI of over 50, who reports snoring and excessive daytime somnolence. I reviewed your office note from 02/19/2018, which you kindly included. She had blood work in your office on 02/19/2018, free T3 was in the normal range, TSH normal, free T4 in the normal range, lipid profile showed LDL of 104, total cholesterol 159, triglycerides 85. CBC without differential showed hemoglobin of 10.1 and hematocrit 31.6. Normal platelets. CMP showed unremarkable findings.  She is being evaluated for bariatric surgery. She has been referred to a bariatric clinic, previously wanted to see somebody in [place] where she is originally from but is now seeking a local referral. She moved from [place] about a year and a half ago. She lives with her paternal grandparents, is estranged from her biological mother. She is the oldest of 7 siblings. She is not aware of any family history of OSA, father died young in his 68s. Her bedtime is typically around 1 or 2 AM as she works from 4 PM to midnight. She is also in school. Rise time is around 10 AM. She has nocturia about once or twice per average night. She denies morning headaches. She is a nonsmoker and does not utilize alcohol, drinks caffeine occasionally in the form of sweet tea. She reports difficulty swallowing when she gets sick. She feels that her tonsils are large. She does  not have recurrent strep throat or tonsillitis typically but when she does get sick she has difficulty breathing. She likes to sleep on her sides and stomach. She has a TV in her bedroom which is on at night but she puts it on a timer.   Her Past Medical History Is Significant For: Past Medical History:  Diagnosis Date  . BMI 40.0-44.9, adult (HCC)   . History of preterm premature rupture of membranes (PPROM)    @ 16wks. G1 pregnancy  . Hypertension   . PCOS (polycystic ovarian syndrome)   . Prediabetes     Her Past Surgical History Is Significant For: Past Surgical History:  Procedure Laterality Date  . KNEE SURGERY    . WISDOM TOOTH EXTRACTION      Her Family History Is Significant For: Family History  Problem Relation Age of Onset  . Hypertension Mother   . Hypertension Father   . Hypertension Brother   . Heart disease Brother   . Stroke Brother   . Stroke Brother 36  . Irritable bowel syndrome Sister   . Diabetes Maternal Grandmother     Her Social History Is Significant For: Social History   Socioeconomic History  . Marital status: Single    Spouse name: Not on file  . Number of children: 0  . Years of education: Not on file  . Highest education level: Not on file  Occupational History  . Occupation: Consulting civil engineer  Social Needs  . Financial resource strain: Not on file  . Food insecurity:    Worry:  Not on file    Inability: Not on file  . Transportation needs:    Medical: Not on file    Non-medical: Not on file  Tobacco Use  . Smoking status: Never Smoker  . Smokeless tobacco: Never Used  Substance and Sexual Activity  . Alcohol use: No  . Drug use: No  . Sexual activity: Yes    Partners: Male  Lifestyle  . Physical activity:    Days per week: Not on file    Minutes per session: Not on file  . Stress: Not on file  Relationships  . Social connections:    Talks on phone: Not on file    Gets together: Not on file    Attends religious service: Not on  file    Active member of club or organization: Not on file    Attends meetings of clubs or organizations: Not on file    Relationship status: Not on file  Other Topics Concern  . Not on file  Social History Narrative  . Not on file    Her Allergies Are:  No Known Allergies:   Her Current Medications Are:  Outpatient Encounter Medications as of 04/20/2018  Medication Sig  . [DISCONTINUED] esomeprazole (NEXIUM) 40 MG capsule Take 1 capsule (40 mg total) by mouth daily.   No facility-administered encounter medications on file as of 04/20/2018.   : Review of Systems:  Out of a complete 14 point review of systems, all are reviewed and negative with the exception of these symptoms as listed below: Review of Systems  Neurological:       Pt presents today to discuss her sleep. Pt has never had a sleep study but does endorse snoring.  Epworth Sleepiness Scale 0= would never doze 1= slight chance of dozing 2= moderate chance of dozing 3= high chance of dozing  Sitting and reading: 1 Watching TV: 1 Sitting inactive in a public place (ex. Theater or meeting): 0 As a passenger in a car for an hour without a break: 1 Lying down to rest in the afternoon: 2 Sitting and talking to someone:0 Sitting quietly after lunch (no alcohol): 2 In a car, while stopped in traffic: 1 Total: 8     Objective:  Neurological Exam  Physical Exam Physical Examination:   Vitals:   04/20/18 1336  BP: (!) 154/75  Pulse: 61   General Examination: The patient is a very pleasant 22 y.o. female in no acute distress. She appears well-developed and well-nourished and well groomed.   HEENT: Normocephalic, atraumatic, pupils are equal, round and reactive to light and accommodation. Extraocular tracking is good without limitation to gaze excursion or nystagmus noted. Normal smooth pursuit is noted. Hearing is grossly intact. Face is symmetric with normal facial animation and normal facial sensation. Speech is  clear with no dysarthria noted. There is no hypophonia. There is no lip, neck/head, jaw or voice tremor. Neck is supple with full range of passive and active motion. There are no carotid bruits on auscultation. Oropharynx exam reveals: mild mouth dryness, adequate dental hygiene and moderate airway crowding, due to larger tonsils and larger uvula. Mallampati is class II. Tongue protrudes centrally and palate elevates symmetrically. Tonsils are 3+ in size. Neck size is 17.75 inches. She has a mild overbite.   Chest: Clear to auscultation without wheezing, rhonchi or crackles noted.  Heart: S1+S2+0, regular and normal without murmurs, rubs or gallops noted.   Abdomen: Soft, non-tender and non-distended with normal bowel sounds appreciated on  auscultation.  Extremities: There is no pitting edema in the distal lower extremities bilaterally. Pedal pulses are intact.  Skin: Warm and dry without trophic changes noted.  Musculoskeletal: exam reveals no obvious joint deformities, tenderness or joint swelling or erythema.   Neurologically:  Mental status: The patient is awake, alert and oriented in all 4 spheres. Her immediate and remote memory, attention, language skills and fund of knowledge are appropriate. There is no evidence of aphasia, agnosia, apraxia or anomia. Speech is clear with normal prosody and enunciation. Thought process is linear. Mood is normal and affect is normal.  Cranial nerves II - XII are as described above under HEENT exam. In addition: shoulder shrug is normal with equal shoulder height noted. Motor exam: Normal bulk, strength and tone is noted. There is no drift, tremor or rebound. Romberg is negative. Reflexes are 2+ throughout. Fine motor skills and coordination: intact with normal finger taps, normal hand movements, normal rapid alternating patting, normal foot taps and normal foot agility.  Cerebellar testing: No dysmetria or intention tremor on finger to nose testing. Heel to  shin is unremarkable bilaterally. There is no truncal or gait ataxia.  Sensory exam: intact to light touch in the upper and lower extremities.  Gait, station and balance: She stands easily. No veering to one side is noted. No leaning to one side is noted. Posture is age-appropriate and stance is narrow based. Gait shows normal stride length and normal pace. No problems turning are noted. Tandem walk is unremarkable.     Assessment and Plan:   In summary, Cherre Huger Sharlet Salina is a very pleasant 22 y.o.-year old female with an underlying medical history of irritable bowel syndrome, PCOS, and morbid obesity with BMI of over 50, whose history and physical exam are concerning for obstructive sleep apnea (OSA). I had a long chat with the patient about my findings and the diagnosis of OSA, its prognosis and treatment options. We talked about medical treatments, surgical interventions and non-pharmacological approaches. I explained in particular the risks and ramifications of untreated moderate to severe OSA, especially with respect to developing cardiovascular disease down the Road, including congestive heart failure, difficult to treat hypertension, cardiac arrhythmias, or stroke. Even type 2 diabetes has, in part, been linked to untreated OSA. Symptoms of untreated OSA include daytime sleepiness, memory problems, mood irritability and mood disorder such as depression and anxiety, lack of energy, as well as recurrent headaches, especially morning headaches. We talked about trying to maintain a healthy lifestyle in general, as well as the importance of weight control. I encouraged the patient to eat healthy, exercise daily and keep well hydrated, to keep a scheduled bedtime and wake time routine, to not skip any meals and eat healthy snacks in between meals. I advised the patient not to drive when feeling sleepy. I recommended the following at this time: sleep study with potential positive airway pressure titration.  (We will score hypopneas at 3%).   I explained the sleep test procedure to the patient and also outlined possible surgical and non-surgical treatment options of OSA, including the use of a custom-made dental device (which would require a referral to a specialist dentist or oral surgeon), upper airway surgical options, such as pillar implants, radiofrequency surgery, tongue base surgery, and UPPP (which would involve a referral to an ENT surgeon). Rarely, jaw surgery such as mandibular advancement may be considered.  I also explained the CPAP treatment option to the patient, who indicated that she would be willing to try  CPAP if the need arises. I explained the importance of being compliant with PAP treatment, not only for insurance purposes but primarily to improve Her symptoms, and for the patient's long term health benefit, including to reduce Her cardiovascular risks. I answered all her questions today and the patient was in agreement. I plan to see her back after the sleep study is completed and encouraged her to call with any interim questions, concerns, problems or updates.   Thank you very much for allowing me to participate in the care of this nice patient. If I can be of any further assistance to you please do not hesitate to call me at [phone].  Sincerely,   Huston Foley, MD, PhD

## 2018-04-23 ENCOUNTER — Encounter (HOSPITAL_COMMUNITY)
Admission: RE | Admit: 2018-04-23 | Discharge: 2018-04-23 | Disposition: A | Discharge status: Home / Self Care | Payer: BLUE CROSS/BLUE SHIELD | Source: Ambulatory Visit | Attending: Internal Medicine | Admitting: Internal Medicine

## 2018-04-23 DIAGNOSIS — R1013 Epigastric pain: Secondary | ICD-10-CM | POA: Insufficient documentation

## 2018-04-23 MED ORDER — TECHNETIUM TC 99M MEBROFENIN IV KIT
5.3000 | PACK | Freq: Once | INTRAVENOUS | Status: AC | PRN
  Administered 2018-04-23: 5.3 via INTRAVENOUS

## 2018-05-01 ENCOUNTER — Ambulatory Visit: Payer: BLUE CROSS/BLUE SHIELD | Admitting: Internal Medicine

## 2018-07-08 ENCOUNTER — Encounter: Payer: BLUE CROSS/BLUE SHIELD | Admitting: Internal Medicine

## 2018-08-10 ENCOUNTER — Emergency Department (HOSPITAL_COMMUNITY)
Admission: EM | Admit: 2018-08-10 | Discharge: 2018-08-11 | Disposition: A | Discharge status: Home / Self Care | Payer: BLUE CROSS/BLUE SHIELD | Attending: Emergency Medicine | Admitting: Emergency Medicine

## 2018-08-10 DIAGNOSIS — R1011 Right upper quadrant pain: Secondary | ICD-10-CM | POA: Insufficient documentation

## 2018-08-10 DIAGNOSIS — Z5321 Procedure and treatment not carried out due to patient leaving prior to being seen by health care provider: Secondary | ICD-10-CM | POA: Diagnosis not present

## 2018-08-11 ENCOUNTER — Other Ambulatory Visit: Payer: Self-pay

## 2018-08-11 ENCOUNTER — Encounter (HOSPITAL_COMMUNITY): Payer: Self-pay

## 2018-08-11 LAB — COMPREHENSIVE METABOLIC PANEL
ALT: 11 U/L (ref 0–44)
AST: 10 U/L — ABNORMAL LOW (ref 15–41)
Albumin: 3.4 g/dL — ABNORMAL LOW (ref 3.5–5.0)
Alkaline Phosphatase: 57 U/L (ref 38–126)
Anion gap: 7 (ref 5–15)
BUN: 7 mg/dL (ref 6–20)
CO2: 26 mmol/L (ref 22–32)
Calcium: 9.3 mg/dL (ref 8.9–10.3)
Chloride: 106 mmol/L (ref 98–111)
Creatinine, Ser: 0.95 mg/dL (ref 0.44–1.00)
GFR calc Af Amer: >60 mL/min (ref >60)
GFR calc non Af Amer: >60 mL/min (ref >60)
Glucose, Bld: 114 mg/dL — ABNORMAL HIGH (ref 70–99)
Potassium: 3.4 mmol/L — ABNORMAL LOW (ref 3.5–5.1)
Sodium: 139 mmol/L (ref 135–145)
Total Bilirubin: 0.5 mg/dL (ref 0.3–1.2)
Total Protein: 7.9 g/dL (ref 6.5–8.1)

## 2018-08-11 LAB — CBC
HCT: 32.3 % — ABNORMAL LOW (ref 36.0–46.0)
Hemoglobin: 10.3 g/dL — ABNORMAL LOW (ref 12.0–15.0)
MCH: 27.3 pg (ref 26.0–34.0)
MCHC: 31.9 g/dL (ref 30.0–36.0)
MCV: 85.7 fL (ref 80.0–100.0)
Platelets: 248 10*3/uL (ref 150–400)
RBC: 3.77 MIL/uL — ABNORMAL LOW (ref 3.87–5.11)
RDW: 15.7 % — ABNORMAL HIGH (ref 11.5–15.5)
WBC: 10.6 10*3/uL — ABNORMAL HIGH (ref 4.0–10.5)
nRBC: 0 % (ref 0.0–0.2)

## 2018-08-11 LAB — URINALYSIS, ROUTINE W REFLEX MICROSCOPIC
Bacteria, UA: NONE SEEN
Bilirubin Urine: NEGATIVE
Glucose, UA: NEGATIVE mg/dL
Hgb urine dipstick: NEGATIVE
Ketones, ur: NEGATIVE mg/dL
Nitrite: NEGATIVE
Protein, ur: 30 mg/dL — AB
Specific Gravity, Urine: 1.03 (ref 1.005–1.030)
pH: 5 (ref 5.0–8.0)

## 2018-08-11 LAB — I-STAT BETA HCG BLOOD, ED (MC, WL, AP ONLY): I-stat hCG, quantitative: <5 m[IU]/mL (ref <5)

## 2018-08-11 LAB — LIPASE, BLOOD: Lipase: 24 U/L (ref 11–51)

## 2018-08-11 NOTE — ED Triage Notes (Signed)
Pt arrives POV for eval of RUQ abd pain radiating to back, much worse after eating, x 3 days. Pt reports nausea, no vomiting. States unknown if she is pregnant. Denies urinary sx, no diarrhea.

## 2018-08-11 NOTE — ED Notes (Signed)
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

## 2018-08-13 ENCOUNTER — Other Ambulatory Visit: Payer: Self-pay

## 2018-08-13 ENCOUNTER — Encounter: Payer: Self-pay | Admitting: Emergency Medicine

## 2018-08-13 DIAGNOSIS — K5732 Diverticulitis of large intestine without perforation or abscess without bleeding: Secondary | ICD-10-CM | POA: Diagnosis not present

## 2018-08-13 DIAGNOSIS — R1011 Right upper quadrant pain: Secondary | ICD-10-CM | POA: Diagnosis present

## 2018-08-13 DIAGNOSIS — R7303 Prediabetes: Secondary | ICD-10-CM | POA: Diagnosis not present

## 2018-08-13 LAB — COMPREHENSIVE METABOLIC PANEL
ALT: 13 U/L (ref 0–44)
AST: 12 U/L — ABNORMAL LOW (ref 15–41)
Albumin: 3.8 g/dL (ref 3.5–5.0)
Alkaline Phosphatase: 63 U/L (ref 38–126)
Anion gap: 9 (ref 5–15)
BUN: 11 mg/dL (ref 6–20)
CO2: 23 mmol/L (ref 22–32)
Calcium: 9.1 mg/dL (ref 8.9–10.3)
Chloride: 104 mmol/L (ref 98–111)
Creatinine, Ser: 0.83 mg/dL (ref 0.44–1.00)
GFR calc Af Amer: >60 mL/min (ref >60)
GFR calc non Af Amer: >60 mL/min (ref >60)
Glucose, Bld: 96 mg/dL (ref 70–99)
Potassium: 3.3 mmol/L — ABNORMAL LOW (ref 3.5–5.1)
Sodium: 136 mmol/L (ref 135–145)
Total Bilirubin: 0.4 mg/dL (ref 0.3–1.2)
Total Protein: 8.3 g/dL — ABNORMAL HIGH (ref 6.5–8.1)

## 2018-08-13 LAB — CBC
HCT: 32.3 % — ABNORMAL LOW (ref 36.0–46.0)
Hemoglobin: 10.3 g/dL — ABNORMAL LOW (ref 12.0–15.0)
MCH: 26.8 pg (ref 26.0–34.0)
MCHC: 31.9 g/dL (ref 30.0–36.0)
MCV: 83.9 fL (ref 80.0–100.0)
Platelets: 292 10*3/uL (ref 150–400)
RBC: 3.85 MIL/uL — ABNORMAL LOW (ref 3.87–5.11)
RDW: 15.4 % (ref 11.5–15.5)
WBC: 11.5 10*3/uL — ABNORMAL HIGH (ref 4.0–10.5)
nRBC: 0 % (ref 0.0–0.2)

## 2018-08-13 LAB — POCT PREGNANCY, URINE: Preg Test, Ur: NEGATIVE

## 2018-08-13 LAB — URINALYSIS, COMPLETE (UACMP) WITH MICROSCOPIC
Bilirubin Urine: NEGATIVE
Glucose, UA: NEGATIVE mg/dL
Hgb urine dipstick: NEGATIVE
Ketones, ur: 5 mg/dL — AB
Nitrite: NEGATIVE
Protein, ur: 30 mg/dL — AB
Specific Gravity, Urine: 1.033 — ABNORMAL HIGH (ref 1.005–1.030)
pH: 6 (ref 5.0–8.0)

## 2018-08-13 LAB — LIPASE, BLOOD: Lipase: 20 U/L (ref 11–51)

## 2018-08-13 NOTE — ED Triage Notes (Signed)
Pt here with c/o RUQ pain radiating to her back, worsening after eating for the past week now, denies N, V. Has had diarrhea for the past few nights, decreased appetite. Denies burning with urination. Appears in NAD, mom with pt.

## 2018-08-14 ENCOUNTER — Emergency Department
Admission: EM | Admit: 2018-08-14 | Discharge: 2018-08-14 | Disposition: A | Discharge status: Home / Self Care | Payer: BLUE CROSS/BLUE SHIELD | Attending: Emergency Medicine | Admitting: Emergency Medicine

## 2018-08-14 ENCOUNTER — Emergency Department: Payer: BLUE CROSS/BLUE SHIELD

## 2018-08-14 ENCOUNTER — Other Ambulatory Visit: Payer: Self-pay

## 2018-08-14 DIAGNOSIS — K5792 Diverticulitis of intestine, part unspecified, without perforation or abscess without bleeding: Secondary | ICD-10-CM

## 2018-08-14 MED ORDER — IOHEXOL 350 MG/ML SOLN
100.0000 mL | Freq: Once | INTRAVENOUS | Status: AC | PRN
  Administered 2018-08-14: 100 mL via INTRAVENOUS

## 2018-08-14 MED ORDER — AMOXICILLIN-POT CLAVULANATE 875-125 MG PO TABS
1.0000 | ORAL_TABLET | Freq: Once | ORAL | Status: AC
  Administered 2018-08-14: 1 via ORAL
  Filled 2018-08-14: qty 1

## 2018-08-14 MED ORDER — AMOXICILLIN-POT CLAVULANATE 875-125 MG PO TABS: 1.0000 | ORAL_TABLET | Freq: Two times a day (BID) | ORAL | 0 refills | Status: AC

## 2018-08-14 NOTE — ED Notes (Signed)
CT done

## 2018-08-14 NOTE — ED Notes (Signed)
Patient discharged to home per MD order. Patient in stable condition, and deemed medically cleared by ED provider for discharge. Discharge instructions reviewed with patient/family using "Teach Back"; verbalized understanding of medication education and administration, and information about follow-up care. Denies further concerns. ° °

## 2018-08-14 NOTE — ED Notes (Signed)
Pt in with co right sided abd pain x 1 week. States no hx of the same, no vomiting but did have diarrhea yesterday. No dysuria or fever.

## 2018-08-15 ENCOUNTER — Ambulatory Visit: Payer: BLUE CROSS/BLUE SHIELD | Admitting: Internal Medicine

## 2018-08-15 ENCOUNTER — Encounter: Payer: Self-pay | Admitting: Internal Medicine

## 2018-08-15 VITALS — BP 108/66 | HR 77 | Temp 98.1°F | Ht 66.0 in | Wt 300.0 lb

## 2018-08-15 DIAGNOSIS — R059 Cough, unspecified: Secondary | ICD-10-CM

## 2018-08-15 DIAGNOSIS — K760 Fatty (change of) liver, not elsewhere classified: Secondary | ICD-10-CM

## 2018-08-15 DIAGNOSIS — K5792 Diverticulitis of intestine, part unspecified, without perforation or abscess without bleeding: Secondary | ICD-10-CM | POA: Diagnosis not present

## 2018-08-15 DIAGNOSIS — Z6841 Body Mass Index (BMI) 40.0 and over, adult: Secondary | ICD-10-CM

## 2018-08-15 DIAGNOSIS — Z712 Person consulting for explanation of examination or test findings: Secondary | ICD-10-CM

## 2018-08-15 DIAGNOSIS — R05 Cough: Secondary | ICD-10-CM

## 2018-08-15 DIAGNOSIS — Z09 Encounter for follow-up examination after completed treatment for conditions other than malignant neoplasm: Secondary | ICD-10-CM | POA: Diagnosis not present

## 2018-08-15 MED ORDER — CEFTRIAXONE SODIUM 1 G IJ SOLR
1.0000 g | Freq: Once | INTRAMUSCULAR | Status: AC
  Administered 2018-08-15: 1 g via INTRAMUSCULAR

## 2018-08-16 NOTE — ED Provider Notes (Signed)
[facility] Emergency Department Provider Note __   First MD Initiated Contact with Patient 08/14/18 0231     (approximate)  I have reviewed the triage vital signs and the nursing notes.   HISTORY  Chief Complaint Abdominal Pain    HPI Lisa Faulkner is a 23 y.o. female presents to the emergency department with right upper quadrant/right flank discomfort which patient states is worse after eating.  Patient states that the pain has been occurring for the past week. she denies any nausea or vomiting.  Patient does admit to diarrhea the last few nights".  She also admits to decreased appetite.  Patient denies any fever afebrile on presentation.  Patient denies any urinary symptoms.     Past Medical History:  Diagnosis Date  . BMI 40.0-44.9, adult (HCC)   . History of preterm premature rupture of membranes (PPROM)    @ 16wks. G1 pregnancy  . Hypertension   . PCOS (polycystic ovarian syndrome)   . Prediabetes     Patient Active Problem List   Diagnosis Date Noted  . Trichimoniasis 09/11/2017  . BMI 40.0-44.9, adult (HCC)   . PCOS (polycystic ovarian syndrome)     Past Surgical History:  Procedure Laterality Date  . KNEE SURGERY    . WISDOM TOOTH EXTRACTION      Prior to Admission medications   Medication Sig Start Date End Date Taking? Authorizing Provider  amoxicillin-clavulanate (AUGMENTIN) 875-125 MG tablet Take 1 tablet by mouth 2 (two) times daily for 14 days. 08/14/18 08/28/18  Darci Current, MD    Allergies Patient has no known allergies.  Family History  Problem Relation Age of Onset  . Hypertension Mother   . Hypertension Father   . Hypertension Brother   . Heart disease Brother   . Stroke Brother   . Stroke Brother 74  . Irritable bowel syndrome Sister   . Diabetes Maternal Grandmother     Social History Social History   Tobacco Use  . Smoking status: Never Smoker  . Smokeless tobacco: Never Used  Substance Use  Topics  . Alcohol use: No  . Drug use: No    Review of Systems Constitutional: No fever/chills Eyes: No visual changes. ENT: No sore throat. Cardiovascular: Denies chest pain. Respiratory: Denies shortness of breath. Gastrointestinal: Positive for abdominal pain and diarrhea. Genitourinary: Negative for dysuria. Musculoskeletal: Negative for neck pain.  Negative for back pain. Integumentary: Negative for rash. Neurological: Negative for headaches, focal weakness or numbness.   ____________________________________________   PHYSICAL EXAM:  VITAL SIGNS: ED Triage Vitals  Enc Vitals Group     BP 08/13/18 2212 (!) 126/48     Pulse Rate 08/13/18 2212 (!) 102     Resp 08/13/18 2212 18     Temp 08/13/18 2212 98.5 F (36.9 C)     Temp Source 08/13/18 2212 Oral     SpO2 08/13/18 2212 98 %     Weight 08/13/18 2215 136.1 kg (300 lb)     Height 08/13/18 2215 1.676 m (5\' 6" )     Head Circumference --      Peak Flow --      Pain Score 08/13/18 2215 8     Pain Loc --      Pain Edu? --      Excl. in GC? --     Constitutional: Alert and oriented. Well appearing and in no acute distress. Eyes: Conjunctivae are normal. PERRL. EOMI. Mouth/Throat: Mucous membranes are moist. Oropharynx non-erythematous.  Neck: No stridor.   Cardiovascular: Normal rate, regular rhythm. Good peripheral circulation. Grossly normal heart sounds. Respiratory: Normal respiratory effort.  No retractions. Lungs CTAB. Gastrointestinal: Right upper quadrant/right lower quadrant tenderness to palpation.. No distention.  Musculoskeletal: No lower extremity tenderness nor edema. No gross deformities of extremities. Neurologic:  Normal speech and language. No gross focal neurologic deficits are appreciated.  Skin:  Skin is warm, dry and intact. No rash noted. Psychiatric: Mood and affect are normal. Speech and behavior are normal.  ____________________________________________   LABS (all labs ordered are  listed, but only abnormal results are displayed)  Labs Reviewed  COMPREHENSIVE METABOLIC PANEL - Abnormal; Notable for the following components:      Result Value   Potassium 3.3 (*)    Total Protein 8.3 (*)    AST 12 (*)    All other components within normal limits  CBC - Abnormal; Notable for the following components:   WBC 11.5 (*)    RBC 3.85 (*)    Hemoglobin 10.3 (*)    HCT 32.3 (*)    All other components within normal limits  URINALYSIS, COMPLETE (UACMP) WITH MICROSCOPIC - Abnormal; Notable for the following components:   Color, Urine AMBER (*)    APPearance CLOUDY (*)    Specific Gravity, Urine 1.033 (*)    Ketones, ur 5 (*)    Protein, ur 30 (*)    Leukocytes, UA TRACE (*)    Bacteria, UA FEW (*)    All other components within normal limits  LIPASE, BLOOD  POC URINE PREG, ED  POCT PREGNANCY, URINE   ______________ ____________________________________________  RADIOLOGY I, [place] Dewayne ShorterN Gottfried Standish, personally viewed and evaluated these images (plain radiographs) as part of my medical decision making, as well as reviewing the written report by the radiologist.  ED MD interpretation: Diffuse diverticular disease with wall thickening with mild surrounding inflammatory changes involving the right colon to the hepatic flexure noted on a CT scan of the abdomen pelvis per the radiologist.     Official radiology report(s): CLINICAL DATA:  Right upper quadrant pain  EXAM: CT ABDOMEN AND PELVIS WITH CONTRAST  TECHNIQUE: Multidetector CT imaging of the abdomen and pelvis was performed using the standard protocol following bolus administration of intravenous contrast.  CONTRAST:  100mL OMNIPAQUE IOHEXOL 350 MG/ML SOLN  COMPARISON:  None.  FINDINGS: Lower chest: Lung bases demonstrate no acute consolidation or effusion. The heart size is  Hepatobiliary: No focal liver abnormality is seen. No gallstones, gallbladder wall thickening, or biliary  dilatation.  Pancreas: Unremarkable. No pancreatic ductal dilatation or surrounding inflammatory changes.  Spleen: Normal in size without focal abnormality.  Adrenals/Urinary Tract: Adrenal glands are unremarkable. Kidneys are normal, without renal calculi, focal lesion, or hydronephrosis. Bladder is unremarkable.  Stomach/Bowel: The stomach is nonenlarged. No dilated small bowel. Negative appendix. Mild wall thickening involving the ascending colon to the hepatic flexure. There is mild surrounding edema in the mesenteric fat. Scattered diverticular disease throughout colon.  Vascular/Lymphatic: No significant vascular findings are present. No enlarged abdominal or pelvic lymph nodes.  Reproductive: Uterus and bilateral adnexa are unremarkable.  Other: No abdominal wall hernia or abnormality. No abdominopelvic ascites.  Musculoskeletal: No fracture is seen.  IMPRESSION: 1. Wall thickening with mild surrounding inflammatory changes involving the right colon to the hepatic flexure, suspicious for colitis. Could consider acute right-sided diverticulitis given the presence of diffuse diverticular disease throughout the colon.   Electronically Signed   By: Jasmine PangKim  Fujinaga M.D.   On:  08/14/2018 03:35   Result History   CT ABDOMEN PELVIS W CONTRAST (Order #[id number]) on 08/14/2018 - Order Result History Report  Encounter-Level Documents - 08/14/2018:   Electronic signature on 08/14/2018 4:53 AM - Signed  Document on 08/14/2018 4:31 AM by Darci CurrentBrown, [place] N, MD: ED After Visit Summary  Electronic signature on 08/14/2018 2  _____________ Procedures   ____________________________________________   INITIAL IMPRESSION / ASSESSMENT AND PLAN / ED COURSE  As part of my medical decision making, I reviewed the following data within the electronic MEDICAL RECORD NUMBER  23 year old female presented with above-stated history and physical exam of right upper quadrant/right lower  quadrant abdominal pain.  Concern for possible gallstones or cholecystitis however review of chart revealed ultrasound and HIDA scan which were negative.  As such CT scan of the abdomen pelvis performed to evaluate for other potential intra-abdominal pathology including appendicitis diverticulitis or enteritis.  CT consistent with possible diverticulitis and as such patient given Augmentin in the emergency department will be prescribed same for home.  Patient referred to gastroenterology.  Regarding the patient's cough chest x-ray was performed which revealed no active cardiopulmonary disease per radiologist.     ____________________________________________  FINAL CLINICAL IMPRESSION(S) / ED DIAGNOSES  Final diagnoses:  Diverticulitis     MEDICATIONS GIVEN DURING THIS VISIT:  Medications  iohexol (OMNIPAQUE) 350 MG/ML injection 100 mL (100 mLs Intravenous Contrast Given 08/14/18 0310)  amoxicillin-clavulanate (AUGMENTIN) 875-125 MG per tablet 1 tablet (1 tablet Oral Given 08/14/18 0416)     ED Discharge Orders         Ordered    amoxicillin-clavulanate (AUGMENTIN) 875-125 MG tablet  2 times daily     08/14/18 0431           Note:  This document was prepared using Dragon voice recognition software and may include unintentional dictation errors.    Darci CurrentBrown, [place] N, MD 08/17/18 0005

## 2018-08-24 ENCOUNTER — Encounter: Payer: Self-pay | Admitting: Internal Medicine

## 2018-08-24 NOTE — Progress Notes (Signed)
Subjective:     Patient ID: Lisa Faulkner , female    DOB: [birth year] , 23 y.o.   MRN: [id number]   Chief Complaint  Patient presents with  . ER f/u    HPI  She is here today for ER follow-up.  She reports she went to [facility] yesterday for further evaluation of abdominal pain.  She c/o right upper quadrant/right flank discomfort which patient states is worse after eating.  Patient states that the pain has been occurring for the past week. She denies any nausea or vomiting.  Patient does admit to diarrhea the last few nights.  She also admits to decreased appetite.  She denies fever/chills/urinary symptoms.   Last night;s evaluation involved ct scan with following findings:  . Wall thickening with mild surrounding inflammatory changes involving the right colon to the hepatic flexure, suspicious for colitis. Could consider acute right-sided diverticulitis given the presence of diffuse diverticular disease throughout the colon.  She was discharged home in stable condition and with a rx for Augmentin. She does feel better today than she did last night.     Past Medical History:  Diagnosis Date  . BMI 40.0-44.9, adult (HCC)   . History of preterm premature rupture of membranes (PPROM)    @ 16wks. G1 pregnancy  . Hypertension   . PCOS (polycystic ovarian syndrome)   . Prediabetes      Family History  Problem Relation Age of Onset  . Hypertension Mother   . Hypertension Father   . Hypertension Brother   . Heart disease Brother   . Stroke Brother   . Stroke Brother 8  . Irritable bowel syndrome Sister   . Diabetes Maternal Grandmother      Current Outpatient Medications:  .  amoxicillin-clavulanate (AUGMENTIN) 875-125 MG tablet, Take 1 tablet by mouth 2 (two) times daily for 14 days., Disp: 28 tablet, Rfl: 0   No Known Allergies   Review of Systems  Constitutional: Negative.   Respiratory: Positive for cough.   Cardiovascular: Negative.   Gastrointestinal: Positive  for abdominal pain.  Neurological: Negative.   Psychiatric/Behavioral: Negative.      Today's Vitals   08/15/18 1116  BP: 108/66  Pulse: 77  Temp: 98.1 F (36.7 C)  TempSrc: Oral  Weight: 300 lb (136.1 kg)  Height: 5\' 6"  (1.676 m)   Body mass index is 48.42 kg/m.   Objective:  Physical Exam Vitals signs and nursing note reviewed.  Constitutional:      Appearance: Normal appearance. She is obese.  HENT:     Head: Normocephalic and atraumatic.  Cardiovascular:     Rate and Rhythm: Normal rate and regular rhythm.     Heart sounds: Normal heart sounds.  Pulmonary:     Effort: Pulmonary effort is normal.     Breath sounds: Normal breath sounds.  Abdominal:     General: Abdomen is flat. Bowel sounds are normal.     Comments: obese  Neurological:     General: No focal deficit present.     Mental Status: She is alert.  Psychiatric:        Mood and Affect: Mood normal.         Assessment And Plan:     1. Diverticulitis  ER records and CT results were reviewed in full detail. She was given Rocephin, 1 gram x 1. She is encouraged to complete full course of abx.  - cefTRIAXone (ROCEPHIN) injection 1 g  2. Fatty liver  She is encouraged  to avoid sugary beverages and refined carbs as much as possible. Importance of regular exercise was also discussed with the patient.   3. Class 3 severe obesity due to excess calories without serious comorbidity with body mass index (BMI) of 45.0 to 49.9 in adult [facility])  She is encouraged to strive for BMI less than 38 to decrease cardiac risk. She is encouraged to exercise no less than 30 minutes five days weekly. We also had an in-depth discussion, along with her mother, regarding food choices and portion sizes.   4. Cough  CXR results from ER reviewed - no acute disease. I do not think abx are warranted, I think her sx are due to PND.  Gwynneth Aliment, MD

## 2018-08-31 ENCOUNTER — Ambulatory Visit: Payer: BLUE CROSS/BLUE SHIELD | Admitting: Nurse Practitioner

## 2018-08-31 ENCOUNTER — Encounter: Payer: Self-pay | Admitting: Nurse Practitioner

## 2018-08-31 VITALS — BP 112/78 | HR 84 | Temp 98.9°F | Ht 66.2 in | Wt 296.6 lb

## 2018-08-31 DIAGNOSIS — N390 Urinary tract infection, site not specified: Secondary | ICD-10-CM | POA: Diagnosis not present

## 2018-08-31 DIAGNOSIS — R112 Nausea with vomiting, unspecified: Secondary | ICD-10-CM

## 2018-08-31 DIAGNOSIS — K5792 Diverticulitis of intestine, part unspecified, without perforation or abscess without bleeding: Secondary | ICD-10-CM

## 2018-08-31 DIAGNOSIS — R110 Nausea: Secondary | ICD-10-CM

## 2018-08-31 DIAGNOSIS — R1110 Vomiting, unspecified: Secondary | ICD-10-CM | POA: Insufficient documentation

## 2018-08-31 DIAGNOSIS — R1013 Epigastric pain: Secondary | ICD-10-CM

## 2018-08-31 LAB — POCT URINALYSIS DIPSTICK
Glucose, UA: NEGATIVE
Nitrite, UA: POSITIVE
Protein, UA: POSITIVE — AB
Spec Grav, UA: 1.025 (ref 1.010–1.025)
Urobilinogen, UA: 1 E.U./dL
pH, UA: 6 (ref 5.0–8.0)

## 2018-08-31 MED ORDER — PROMETHAZINE HCL 12.5 MG PO TABS: 12.5000 mg | ORAL_TABLET | Freq: Four times a day (QID) | ORAL | 0 refills | Status: DC | PRN

## 2018-08-31 MED ORDER — CIPROFLOXACIN HCL 500 MG PO TABS: 500.0000 mg | ORAL_TABLET | Freq: Two times a day (BID) | ORAL | 0 refills | Status: AC

## 2018-08-31 NOTE — Progress Notes (Signed)
Subjective:     Patient ID: Lisa Faulkner , female    DOB: 1996/02/20 , 23 y.o.   MRN: [id number]   Chief Complaint  Patient presents with  . Emesis    Patient states she has been having nausea since thursday. she stated she was in here not to long ago and was diagnosed with diverticulitis. she stated feels backed up.    HPI  Patient's last menstrual period was 08/30/2018.  Prior to today she had not had a menstrual cycle for 2 months.     Emesis   This is a new problem. The current episode started in the past 7 days. The problem occurs less than 2 times per day. The emesis has an appearance of stomach contents. There has been no fever. Pertinent negatives include no abdominal pain, arthralgias, chest pain, coughing or fever. Associated symptoms comments: Nausea . She has tried nothing for the symptoms.     Past Medical History:  Diagnosis Date  . BMI 40.0-44.9, adult (HCC)   . History of preterm premature rupture of membranes (PPROM)    @ 16wks. G1 pregnancy  . Hypertension   . PCOS (polycystic ovarian syndrome)   . Prediabetes      Family History  Problem Relation Age of Onset  . Hypertension Mother   . Hypertension Father   . Hypertension Brother   . Heart disease Brother   . Stroke Brother   . Stroke Brother [id number]  . Irritable bowel syndrome Sister   . Diabetes Maternal Grandmother     No current outpatient medications on file.   No Known Allergies   Review of Systems  Constitutional: Negative for fatigue and fever.  Eyes: Negative for photophobia.  Respiratory: Negative for cough.   Cardiovascular: Negative for chest pain, palpitations and leg swelling.  Gastrointestinal: Positive for vomiting. Negative for abdominal pain.  Musculoskeletal: Negative for arthralgias.     Today's Vitals   08/31/18 1136  BP: 112/78  Pulse: 84  Temp: 98.9 F (37.2 C)  TempSrc: Oral  SpO2: 99%  Weight: 296 lb 9.6 oz (134.5 kg)  Height: 5' 6.2" (1.681 m)  PainSc: 0-No  pain   Body mass index is 47.58 kg/m.   Objective:  Physical Exam Constitutional:      Appearance: Normal appearance.  Cardiovascular:     Rate and Rhythm: Normal rate and regular rhythm.     Pulses: Normal pulses.     Heart sounds: Normal heart sounds. No murmur.  Pulmonary:     Effort: Pulmonary effort is normal.     Breath sounds: Normal breath sounds.  Neurological:     Mental Status: She is alert.         Assessment And Plan:     1. Nausea  4 day history of nausea  Viral vs constipation vs unknown etiology  I am referring her to GI for further evaluation - Ambulatory referral to Gastroenterology - promethazine (PHENERGAN) 12.5 MG tablet; Take 1 tablet (12.5 mg total) by mouth every 6 (six) hours as needed for nausea or vomiting.  Dispense: 10 tablet; Refill: 0  2. Non-intractable vomiting with nausea, unspecified vomiting type  One episode this morning with vomiting  I will provide a limited amount of promethazine for the nausea aware to not operate heavy machinery or drive while taking - Ambulatory referral to Gastroenterology  3. Epigastric pain  Intermittent epigastric pain, has had Hyda scan which was normal  She was to see GI in October I will  re refer - Ambulatory referral to Gastroenterology  4. Diverticulitis  Treated in January with Amoxicillin and Rocephin - Ambulatory referral to Gastroenterology  5. Urinary tract infection without hematuria, site unspecified  Urine is positive for nitrates, has large blood however her menstrual cycle is currently on.  Will send urine for culture. - Culture, Urine - ciprofloxacin (CIPRO) 500 MG tablet; Take 1 tablet (500 mg total) by mouth 2 (two) times daily for 10 days.  Dispense: 20 tablet; Refill: 0 - POCT Urinalysis Dipstick (90383)     Arnette Felts, FNP

## 2018-08-31 NOTE — Patient Instructions (Addendum)
Nausea, Adult Nausea is feeling sick to your stomach or feeling that you are about to throw up (vomit). Feeling sick to your stomach is usually not serious, but it may be an early sign of a more serious medical problem. As you feel sicker to your stomach, you may throw up. If you throw up, or if you are not able to drink enough fluids, there is a risk that you may lose too much water in your body (get dehydrated). If you lose too much water in your body, you may:  Feel tired.  Feel thirsty.  Have a dry mouth.  Have cracked lips.  Go pee (urinate) less often. Older adults and people who have other diseases or a weak body defense system (immune system) have a higher risk of losing too much water in the body. The main goals of treating this condition are:  To relieve your nausea.  To ensure your nausea occurs less often.  To prevent throwing up and losing too much fluid. Follow these instructions at home: Watch your symptoms for any changes. Tell your doctor about them. Follow these instructions as told by your doctor. Eating and drinking      Take an ORS (oral rehydration solution). This is a drink that is sold at pharmacies and stores.  Drink clear fluids in small amounts as you are able. These include: ? Water. ? Ice chips. ? Fruit juice that has water added (diluted fruit juice). ? Low-calorie sports drinks.  Eat bland, easy-to-digest foods in small amounts as you are able, such as: ? Bananas. ? Applesauce. ? Rice. ? Low-fat (lean) meats. ? Toast. ? Crackers.  Avoid drinking fluids that have a lot of sugar or caffeine in them. This includes energy drinks, sports drinks, and soda.  Avoid alcohol.  Avoid spicy or fatty foods. General instructions  Take over-the-counter and prescription medicines only as told by your doctor.  Rest at home while you get better.  Drink enough fluid to keep your pee (urine) pale yellow.  Take slow and deep breaths when you feel  sick to your stomach.  Avoid food or things that have strong smells.  Wash your hands often with soap and water. If you cannot use soap and water, use hand sanitizer.  Make sure that all people in your home wash their hands well and often.  Keep all follow-up visits as told by your doctor. This is important. Contact a doctor if:  You feel sicker to your stomach.  You feel sick to your stomach for more than 2 days.  You throw up.  You are not able to drink fluids without throwing up.  You have new symptoms.  You have a fever.  You have a headache.  You have muscle cramps.  You have a rash.  You have pain while peeing.  You feel light-headed or dizzy. Get help right away if:  You have pain in your chest, neck, arm, or jaw.  You feel very weak or you pass out (faint).  You have throw up that is bright red or looks like coffee grounds.  You have bloody or black poop (stools) or poop that looks like tar.  You have a very bad headache, a stiff neck, or both.  You have very bad pain, cramping, or bloating in your belly (abdomen).  You have trouble breathing or you are breathing very quickly.  Your heart is beating very quickly.  Your skin feels cold and clammy.  You feel confused.    You have signs of losing too much water in your body, such as: ? Dark pee, very little pee, or no pee. ? Cracked lips. ? Dry mouth. ? Sunken eyes. ? Sleepiness. ? Weakness. These symptoms may be an emergency. Do not wait to see if the symptoms will go away. Get medical help right away. Call your local emergency services (911 in the [place] Do not drive yourself to the hospital. Summary  Nausea is feeling sick to your stomach or feeling that you are about to throw up (vomit).  If you throw up, or if you are not able to drink enough fluids, there is a risk that you may lose too much water in your body (get dehydrated).  Eat and drink what your doctor tells you. Take  over-the-counter and prescription medicines only as told by your doctor.  Contact a doctor right away if your symptoms get worse or you have new symptoms.  Keep all follow-up visits as told by your doctor. This is important. This information is not intended to replace advice given to you by your health care provider. Make sure you discuss any questions you have with your health care provider. Document Released: 07/11/2011 Document Revised: 12/30/2017 Document Reviewed: 12/30/2017 Urinary Tract Infection, Adult A urinary tract infection (UTI) is an infection of any part of the urinary tract. The urinary tract includes: The kidneys. The ureters. The bladder. The urethra. These organs make, store, and get rid of pee (urine) in the body. What are the causes? This is caused by germs (bacteria) in your genital area. These germs grow and cause swelling (inflammation) of your urinary tract. What increases the risk? You are more likely to develop this condition if: You have a small, thin tube (catheter) to drain pee. You cannot control when you pee or poop (incontinence). You are female, and: You use these methods to prevent pregnancy: A medicine that kills sperm (spermicide). A device that blocks sperm (diaphragm). You have low levels of a female hormone (estrogen). You are pregnant. You have genes that add to your risk. You are sexually active. You take antibiotic medicines. You have trouble peeing because of: A prostate that is bigger than normal, if you are female. A blockage in the part of your body that drains pee from the bladder (urethra). A kidney stone. A nerve condition that affects your bladder (neurogenic bladder). Not getting enough to drink. Not peeing often enough. You have other conditions, such as: Diabetes. A weak disease-fighting system (immune system). Sickle cell disease. Gout. Injury of the spine. What are the signs or symptoms? Symptoms of this condition  include: Needing to pee right away (urgently). Peeing often. Peeing small amounts often. Pain or burning when peeing. Blood in the pee. Pee that smells bad or not like normal. Trouble peeing. Pee that is cloudy. Fluid coming from the vagina, if you are female. Pain in the belly or lower back. Other symptoms include: Throwing up (vomiting). No urge to eat. Feeling mixed up (confused). Being tired and grouchy (irritable). A fever. Watery poop (diarrhea). How is this treated? This condition may be treated with: Antibiotic medicine. Other medicines. Drinking enough water. Follow these instructions at home:  Medicines Take over-the-counter and prescription medicines only as told by your doctor. If you were prescribed an antibiotic medicine, take it as told by your doctor. Do not stop taking it even if you start to feel better. General instructions Make sure you: Pee until your bladder is empty. Do not hold pee  for a long time. Empty your bladder after sex. Wipe from front to back after pooping if you are a female. Use each tissue one time when you wipe. Drink enough fluid to keep your pee pale yellow. Keep all follow-up visits as told by your doctor. This is important. Contact a doctor if: You do not get better after 1-2 days. Your symptoms go away and then come back. Get help right away if: You have very bad back pain. You have very bad pain in your lower belly. You have a fever. You are sick to your stomach (nauseous). You are throwing up. Summary A urinary tract infection (UTI) is an infection of any part of the urinary tract. This condition is caused by germs in your genital area. There are many risk factors for a UTI. These include having a small, thin tube to drain pee and not being able to control when you pee or poop. Treatment includes antibiotic medicines for germs. Drink enough fluid to keep your pee pale yellow. This information is not intended to replace  advice given to you by your health care provider. Make sure you discuss any questions you have with your health care provider. Document Released: 01/08/2008 Document Revised: 01/29/2018 Document Reviewed: 01/29/2018 Elsevier Interactive Patient Education  2019 [employer].  Risk analyst Patient Education  [employer].

## 2018-09-01 ENCOUNTER — Encounter: Payer: Self-pay | Admitting: Nurse Practitioner

## 2018-09-01 LAB — URINE CULTURE

## 2018-09-05 ENCOUNTER — Ambulatory Visit: Payer: BLUE CROSS/BLUE SHIELD | Admitting: Internal Medicine

## 2019-01-25 ENCOUNTER — Telehealth: Payer: Self-pay

## 2019-01-25 ENCOUNTER — Telehealth: Payer: Self-pay | Admitting: General Practice

## 2019-01-25 ENCOUNTER — Encounter: Payer: Self-pay | Admitting: Internal Medicine

## 2019-01-25 ENCOUNTER — Ambulatory Visit (INDEPENDENT_AMBULATORY_CARE_PROVIDER_SITE_OTHER): Payer: BC Managed Care – PPO | Admitting: Internal Medicine

## 2019-01-25 ENCOUNTER — Other Ambulatory Visit: Payer: Self-pay

## 2019-01-25 DIAGNOSIS — Z20822 Contact with and (suspected) exposure to covid-19: Secondary | ICD-10-CM

## 2019-01-25 DIAGNOSIS — J039 Acute tonsillitis, unspecified: Secondary | ICD-10-CM | POA: Diagnosis not present

## 2019-01-25 DIAGNOSIS — R6889 Other general symptoms and signs: Secondary | ICD-10-CM

## 2019-01-25 MED ORDER — FLUCONAZOLE 150 MG PO TABS: 150.0000 mg | ORAL_TABLET | Freq: Every day | ORAL | 0 refills | Status: DC

## 2019-01-25 MED ORDER — AMOXICILLIN 500 MG PO CAPS: 500.0000 mg | ORAL_CAPSULE | Freq: Three times a day (TID) | ORAL | 0 refills | Status: DC

## 2019-01-25 NOTE — Telephone Encounter (Signed)
Pt has been scheduled for Covid testing.  ° °

## 2019-01-25 NOTE — Patient Instructions (Signed)
Tonsillitis    Tonsillitis is an infection of the throat. This infection causes the tonsils to become red, tender, and swollen. Tonsils are tissues in the back of your throat. If bacteria caused your infection, antibiotic medicine will be given to you. Sometimes, symptoms of this infection can be treated with the use of medicines that lessen swelling (steroids). If your tonsillitis is very bad (severe) and happens often, you may need to get your tonsils removed (tonsillectomy).  Follow these instructions at home:  Medicines   Take over-the-counter and prescription medicines only as told by your doctor.   If you were prescribed an antibiotic, take it as told by your doctor. Do not stop taking the antibiotic even if you start to feel better.  Eating and drinking   Drink enough fluid to keep your pee (urine) clear or pale yellow.   While your throat is sore, eat soft or liquid foods like:  ? Soup.  ? Sherbert.  ? Instant breakfast drinks.   Drink warm fluids.   Eat frozen ice pops.  General instructions   Rest as much as possible and get plenty of sleep.   Gargle with a salt-water mixture 3-4 times a day or as needed. To make a salt-water mixture, completely dissolve -1 tsp of salt in 1 cup of warm water.   Wash your hands often with soap and water. If there is no soap and water, use hand sanitizer.   Do not share cups, bottles, or other utensils until your symptoms are gone.   Do not smoke. If you need help quitting, ask your doctor.   Keep all follow-up visits as told by your doctor. This is important.  Contact a doctor if:   You have large, tender lumps in your neck.   You have a fever that does not go away after 2-3 days.   You have a rash.   You cough up green, yellow-brown, or bloody fluid.   You cannot swallow liquids or food for 24 hours.   Only one of your tonsils is swollen.  Get help right away if:   You have any new symptoms such as:  ? Vomiting.  ? Very bad headache.  ? Stiff  neck.  ? Chest pain.  ? Trouble breathing or swallowing.   You have very bad throat pain and you also have drooling or voice changes.   You have very bad pain that is not helped by medicine.   You cannot fully open your mouth.   You have redness, swelling, or severe pain anywhere in your neck.  Summary   Tonsillitis causes your tonsils to be red, tender, and swollen.   While your throat is sore, eat soft or liquid foods.   Gargle with a salt-water mixture 3-4 times a day or as needed.   Do not share cups, bottles, or other utensils until your symptoms are gone.  This information is not intended to replace advice given to you by your health care provider. Make sure you discuss any questions you have with your health care provider.  Document Released: 01/08/2008 Document Revised: 08/27/2016 Document Reviewed: 08/27/2016  Elsevier Interactive Patient Education  2019 [employer] Inc[employer]

## 2019-01-25 NOTE — Addendum Note (Signed)
Addended by: Dimple Nanas on: 01/25/2019 05:06 PM   Modules accepted: Orders

## 2019-01-25 NOTE — Telephone Encounter (Signed)
Pt has been scheduled for covid testing.  Scheduled with pt directly. Pt has been scheduled at the [facility] testing site.  Pt was referred by: Glendale Chard, MD

## 2019-01-25 NOTE — Telephone Encounter (Signed)
Hello,  Dr. Baird Cancer would like the patient to be contacted and scheduled for coronavirus testing.  Thank you.

## 2019-01-26 ENCOUNTER — Encounter: Payer: Self-pay | Admitting: Internal Medicine

## 2019-01-26 ENCOUNTER — Other Ambulatory Visit: Payer: BLUE CROSS/BLUE SHIELD

## 2019-01-26 DIAGNOSIS — Z20822 Contact with and (suspected) exposure to covid-19: Secondary | ICD-10-CM

## 2019-01-30 LAB — NOVEL CORONAVIRUS, NAA: SARS-CoV-2, NAA: NOT DETECTED

## 2019-02-01 ENCOUNTER — Encounter: Payer: Self-pay | Admitting: Internal Medicine

## 2019-02-03 ENCOUNTER — Other Ambulatory Visit: Payer: Self-pay | Admitting: Internal Medicine

## 2019-02-03 ENCOUNTER — Encounter: Payer: Self-pay | Admitting: Nurse Practitioner

## 2019-02-03 ENCOUNTER — Other Ambulatory Visit: Payer: Self-pay

## 2019-02-03 DIAGNOSIS — J039 Acute tonsillitis, unspecified: Secondary | ICD-10-CM

## 2019-02-03 MED ORDER — AMOXICILLIN-POT CLAVULANATE 875-125 MG PO TABS: 1.0000 | ORAL_TABLET | Freq: Two times a day (BID) | ORAL | 0 refills | Status: AC

## 2019-02-21 NOTE — Progress Notes (Addendum)
Virtual Visit via Video   This visit type was conducted due to national recommendations for restrictions regarding the COVID-19 Pandemic (e.g. social distancing) in an effort to limit this patient's exposure and mitigate transmission in our community.  Due to her co-morbid illnesses, this patient is at least at moderate risk for complications without adequate follow up.  This format is felt to be most appropriate for this patient at this time.  All issues noted in this document were discussed and addressed.  A limited physical exam was performed with this format.    This visit type was conducted due to national recommendations for restrictions regarding the COVID-19 Pandemic (e.g. social distancing) in an effort to limit this patient's exposure and mitigate transmission in our community.  Patients identity confirmed using two different identifiers.  This format is felt to be most appropriate for this patient at this time.  All issues noted in this document were discussed and addressed.  No physical exam was performed (except for noted visual exam findings with Video Visits).    Date:  01/25/2019   ID:  Lisa Faulkner, DOB 1995/12/23, MRN [id number]  Patient Location:  Home  Provider location:   Office    Chief Complaint:  I have a sore throat.   History of Present Illness:    Lisa Faulkner is a 23 y.o. female who presents via video conferencing for a telehealth visit today.    The patient does have symptoms concerning for COVID-19 infection (fever, chills, cough, or new shortness of breath).   She presents today for virtual visit. She prefers this method of contact due to COVID-19 pandemic.  She reports she     Sore Throat  This is a new problem. The current episode started in the past 7 days. The problem has been gradually worsening. Neither side of throat is experiencing more pain than the other. There has been no fever. The pain is at a severity of 5/10. The pain is  moderate. Associated symptoms include a hoarse voice and swollen glands. Pertinent negatives include no congestion, coughing or shortness of breath. She has tried nothing for the symptoms. The treatment provided no relief.     Past Medical History:  Diagnosis Date  . BMI 40.0-44.9, adult ([place])   . History of preterm premature rupture of membranes (PPROM)    @ 16wks. G1 pregnancy  . Hypertension   . PCOS (polycystic ovarian syndrome)   . Prediabetes    Past Surgical History:  Procedure Laterality Date  . KNEE SURGERY    . WISDOM TOOTH EXTRACTION       No outpatient medications have been marked as taking for the 01/25/19 encounter (Office Visit) with Glendale Chard, MD.     Allergies:   Patient has no known allergies.   Social History   Tobacco Use  . Smoking status: Never Smoker  . Smokeless tobacco: Never Used  Substance Use Topics  . Alcohol use: No  . Drug use: No     Family Hx: The patient's family history includes Diabetes in her maternal grandmother; Heart disease in her brother; Hypertension in her brother, father, and mother; Irritable bowel syndrome in her sister; Stroke in her brother; Stroke (age of onset: 67) in her brother.  ROS:   Please see the history of present illness.    Review of Systems  Constitutional: Negative.   HENT: Positive for hoarse voice. Negative for congestion.   Respiratory: Negative.  Negative for cough and shortness of breath.  Cardiovascular: Negative.   Gastrointestinal: Negative.   Neurological: Negative.   Psychiatric/Behavioral: Negative.     All other systems reviewed and are negative.   Labs/Other Tests and Data Reviewed:    Recent Labs: 08/13/2018: ALT 13; BUN 11; Creatinine, Ser 0.83; Hemoglobin 10.3; Platelets 292; Potassium 3.3; Sodium 136   Recent Lipid Panel No results found for: CHOL, TRIG, HDL, CHOLHDL, LDLCALC, LDLDIRECT  Wt Readings from Last 3 Encounters:  08/31/18 296 lb 9.6 oz (134.5 kg)  08/15/18 300 lb  (136.1 kg)  08/13/18 300 lb (136.1 kg)     Exam:    Vital Signs:  LMP  (LMP Unknown)     Physical Exam  Constitutional: She is oriented to person, place, and time and well-developed, well-nourished, and in no distress.  HENT:  Head: Normocephalic and atraumatic.  Mouth/Throat: Posterior oropharyngeal erythema present.  Markedly enlarged tonsils bilateral Difficult to assess if exudate is present  Neck: Normal range of motion.  Pulmonary/Chest: Effort normal.  Neurological: She is alert and oriented to person, place, and time.  Psychiatric: Affect normal.  Nursing note and vitals reviewed.    ASSESSMENT & PLAN:     1. Tonsillitis  I will send in rx amoxicillin. She is encouraged to take full course of abx. I will also send in rx diflucan to use if needed.   2. Suspected Covid-19 Virus Infection  She agrees to go for COVID testing. [facility]. She was advised to self-quarantine. She is encouraged to touch base with me daily on her progress. She will try to have family member bring her a thermometer so she can monitor her temperature.   - Temperature monitoring; Future    COVID-19 Education: The signs and symptoms of COVID-19 were discussed with the patient and how to seek care for testing (follow up with PCP or arrange E-visit).  The importance of social distancing was discussed today.  Patient Risk:   After full review of this patients clinical status, I feel that they are at least moderate risk at this time.  Time:   Today, I have spent 8 minutes/ 10 seconds with the patient with telehealth technology discussing above diagnoses.     Medication Adjustments/Labs and Tests Ordered: Current medicines are reviewed at length with the patient today.  Concerns regarding medicines are outlined above.   Tests Ordered: No orders of the defined types were placed in this encounter.   Medication Changes: Meds  ordered this encounter  Medications  . amoxicillin (AMOXIL) 500 MG capsule    Sig: Take 1 capsule (500 mg total) by mouth 3 (three) times daily.    Dispense:  21 capsule    Refill:  0    Disposition:  Follow up prn  Signed, Gwynneth Alimentobyn N Cheril Slattery, MD

## 2019-04-11 IMAGING — CT CT ABD-PELV W/ CM
2 of 4 series · 16 of 46 positions shown, 18 images · IV contrast (omnipaque)
Comparison: None.

CLINICAL DATA: Right upper quadrant pain

EXAM:
CT ABDOMEN AND PELVIS WITH CONTRAST
TECHNIQUE: Multidetector CT imaging of the abdomen and pelvis was performed
using the standard protocol following bolus administration of
intravenous contrast.
CONTRAST:  100mL OMNIPAQUE IOHEXOL 350 MG/ML SOLN

[Series 2: routine abd/pel with · axial · 0.97mm/px · z∈[-982,-516]mm · 13 of 103 slices shown, 15 images]
[im 5/103  soft-tissue]
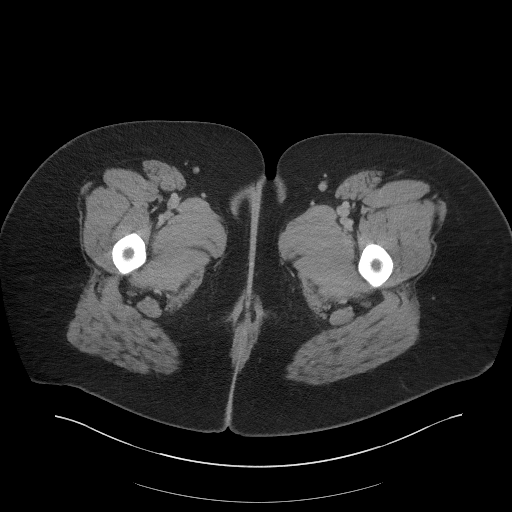
[im 5/103  bone]
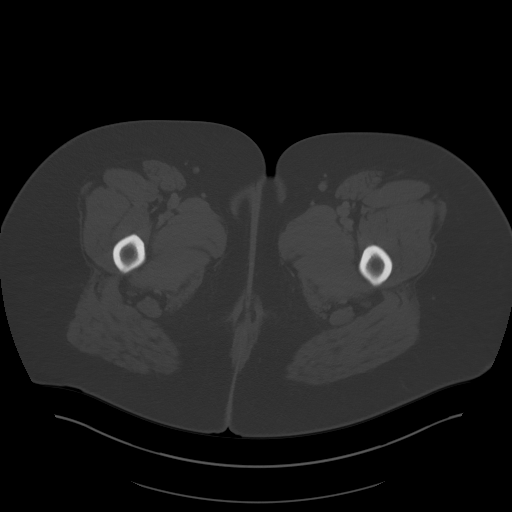
[im 13/103  soft-tissue]
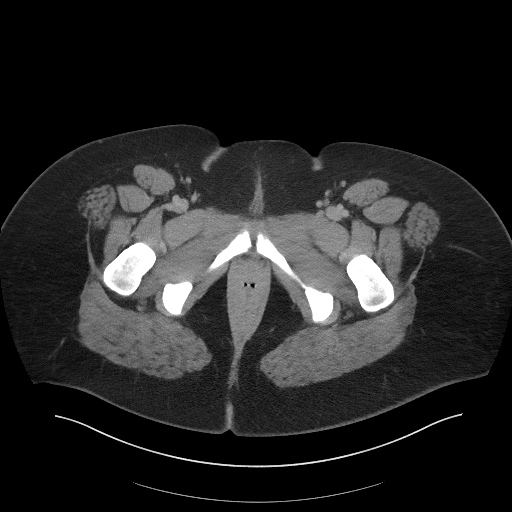
[im 22/103  soft-tissue]
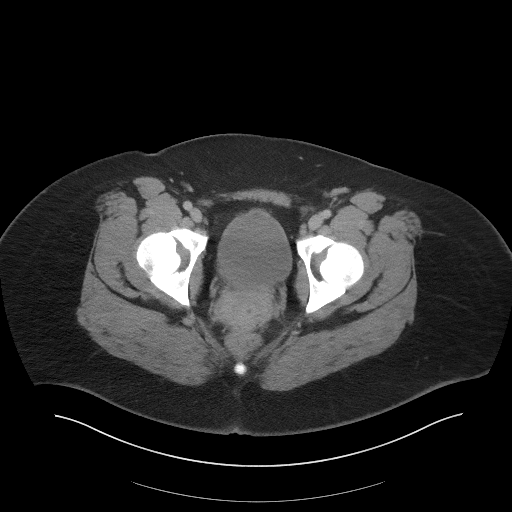
[im 30/103  soft-tissue]
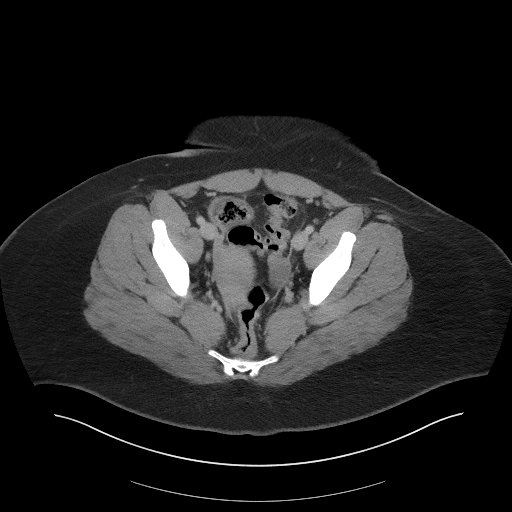
[im 35/103  soft-tissue]
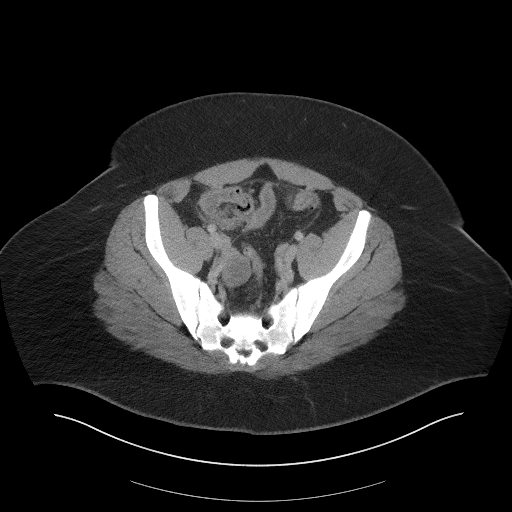
[im 43/103  soft-tissue]
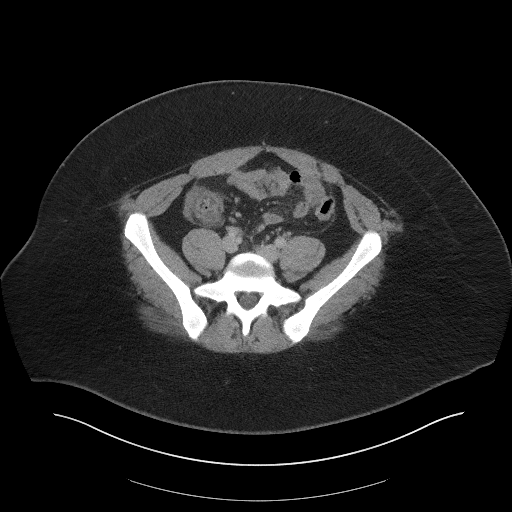
[im 52/103  soft-tissue]
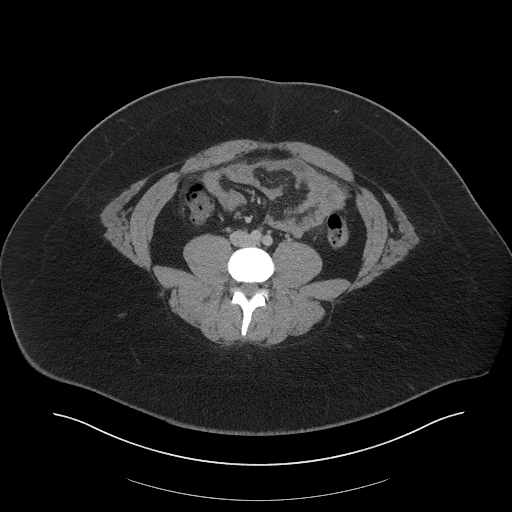
[im 60/103  soft-tissue]
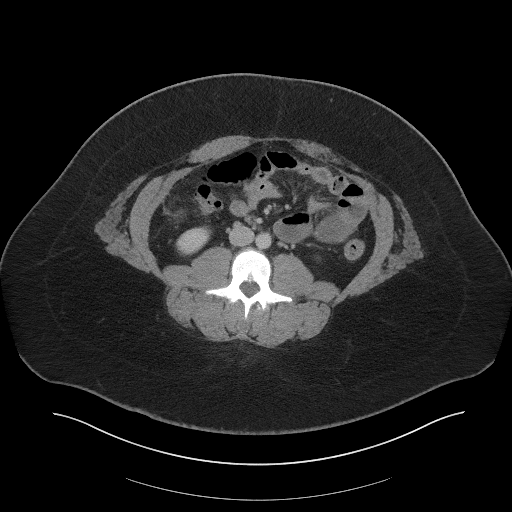
[im 69/103  soft-tissue]
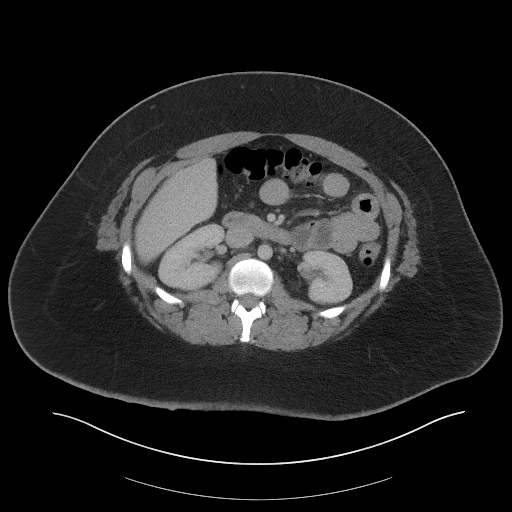
[im 69/103  bone]
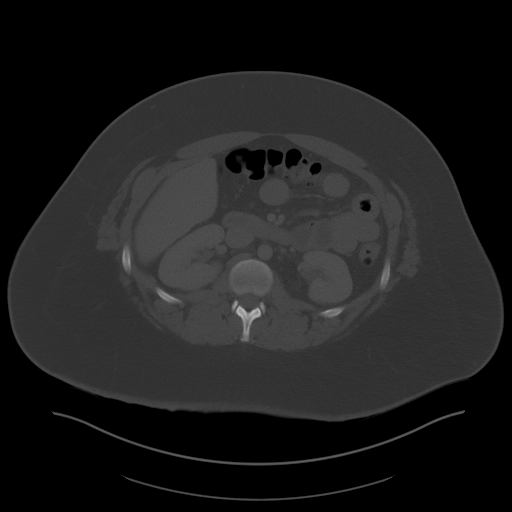
[im 73/103  soft-tissue]
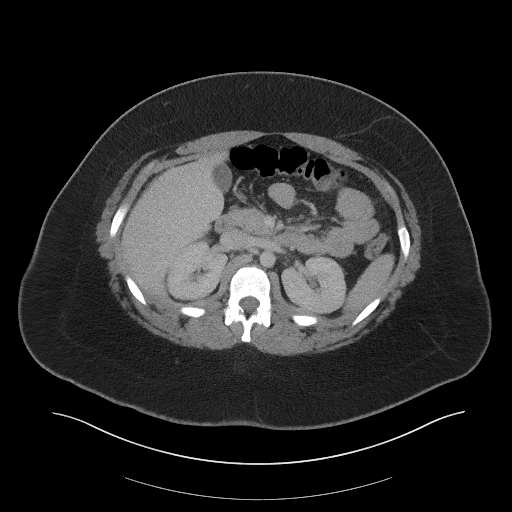
[im 81/103  soft-tissue]
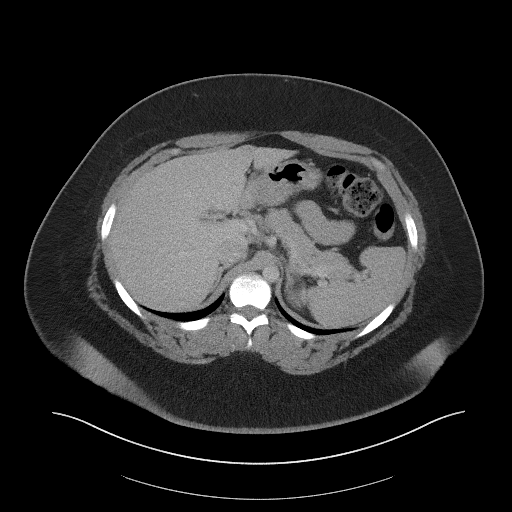
[im 90/103  soft-tissue]
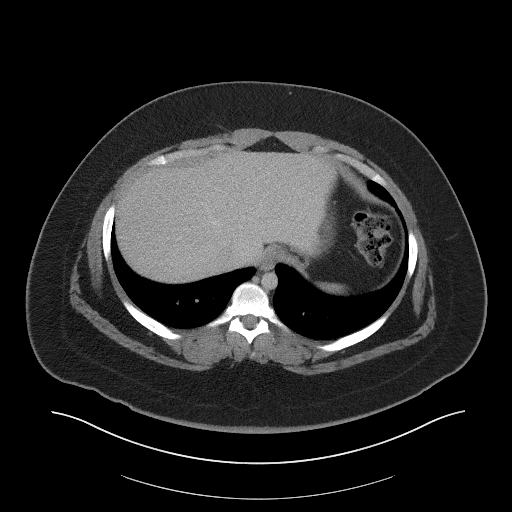
[im 98/103  soft-tissue]
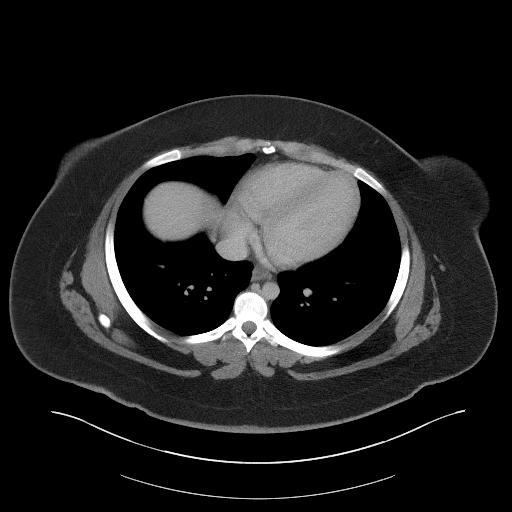

[Series 5: coronal st · coronal · 0.83mm/px · 3 of 98 slices shown]
[im 33/98  soft-tissue]
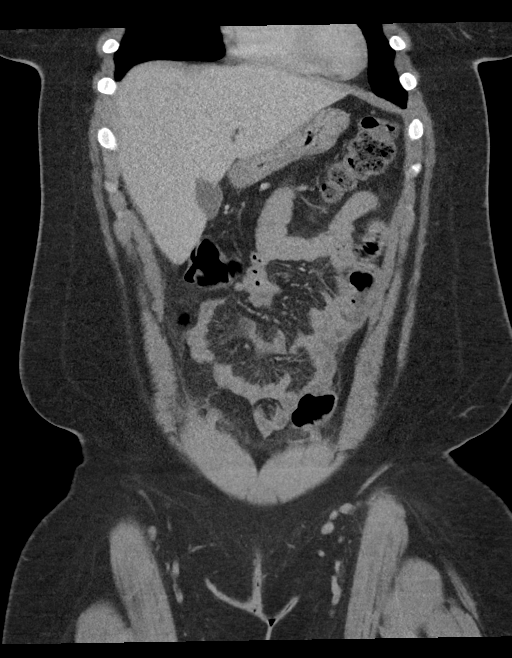
[im 44/98  soft-tissue]
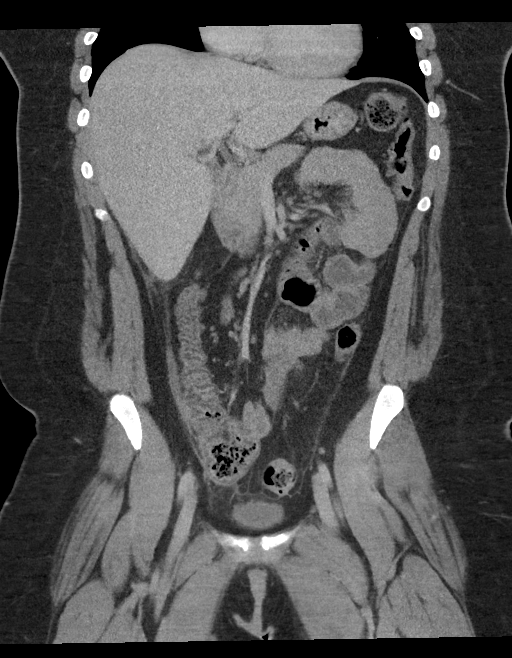
[im 54/98  soft-tissue]
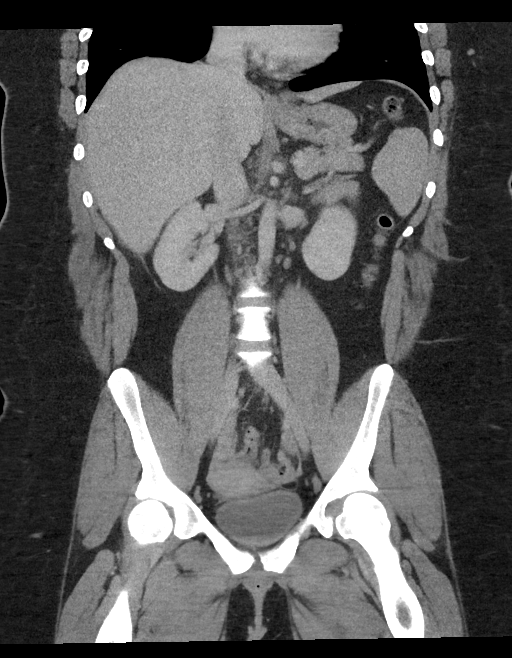

[16 of 46 positions shown; findings below may reference images not displayed]

FINDINGS: Lower chest: Lung bases demonstrate no acute consolidation or
effusion. The heart size is

Hepatobiliary: No focal liver abnormality is seen. No gallstones,
gallbladder wall thickening, or biliary dilatation.

Pancreas: Unremarkable. No pancreatic ductal dilatation or
surrounding inflammatory changes.

Spleen: Normal in size without focal abnormality.

Adrenals/Urinary Tract: Adrenal glands are unremarkable. Kidneys are
normal, without renal calculi, focal lesion, or hydronephrosis.
Bladder is unremarkable.

Stomach/Bowel: The stomach is nonenlarged. No dilated small bowel.
Negative appendix. Mild wall thickening involving the ascending
colon to the hepatic flexure. There is mild surrounding edema in the
mesenteric fat. Scattered diverticular disease throughout colon.

Vascular/Lymphatic: No significant vascular findings are present. No
enlarged abdominal or pelvic lymph nodes.

Reproductive: Uterus and bilateral adnexa are unremarkable.

Other: No abdominal wall hernia or abnormality. No abdominopelvic
ascites.

Musculoskeletal: No fracture is seen.
IMPRESSION: 1. Wall thickening with mild surrounding inflammatory changes
involving the right colon to the hepatic flexure, suspicious for
colitis. Could consider acute right-sided diverticulitis given the
presence of diffuse diverticular disease throughout the colon.

## 2019-04-26 ENCOUNTER — Ambulatory Visit: Payer: 59 | Admitting: Nurse Practitioner

## 2019-04-26 ENCOUNTER — Encounter: Payer: Self-pay | Admitting: Nurse Practitioner

## 2019-04-26 ENCOUNTER — Ambulatory Visit: Payer: BC Managed Care – PPO | Admitting: Nurse Practitioner

## 2019-04-26 ENCOUNTER — Other Ambulatory Visit: Payer: Self-pay

## 2019-04-26 VITALS — BP 120/76 | HR 90 | Temp 98.3°F | Ht 66.8 in | Wt 329.4 lb

## 2019-04-26 DIAGNOSIS — R350 Frequency of micturition: Secondary | ICD-10-CM

## 2019-04-26 DIAGNOSIS — R1084 Generalized abdominal pain: Secondary | ICD-10-CM

## 2019-04-26 DIAGNOSIS — Z23 Encounter for immunization: Secondary | ICD-10-CM | POA: Diagnosis not present

## 2019-04-26 DIAGNOSIS — N926 Irregular menstruation, unspecified: Secondary | ICD-10-CM

## 2019-04-26 DIAGNOSIS — M62838 Other muscle spasm: Secondary | ICD-10-CM | POA: Diagnosis not present

## 2019-04-26 DIAGNOSIS — R21 Rash and other nonspecific skin eruption: Secondary | ICD-10-CM

## 2019-04-26 LAB — POCT URINALYSIS DIPSTICK
Bilirubin, UA: NEGATIVE
Glucose, UA: NEGATIVE
Ketones, UA: NEGATIVE
Nitrite, UA: NEGATIVE
Protein, UA: NEGATIVE
Spec Grav, UA: 1.02 (ref 1.010–1.025)
Urobilinogen, UA: 0.2 E.U./dL
pH, UA: 6.5 (ref 5.0–8.0)

## 2019-04-26 LAB — POCT URINE PREGNANCY: Preg Test, Ur: NEGATIVE

## 2019-04-26 MED ORDER — OMEPRAZOLE 20 MG PO CPDR: 20.0000 mg | DELAYED_RELEASE_CAPSULE | Freq: Every day | ORAL | 1 refills | Status: DC

## 2019-04-26 MED ORDER — CYCLOBENZAPRINE HCL 10 MG PO TABS: 10.0000 mg | ORAL_TABLET | Freq: Three times a day (TID) | ORAL | 0 refills | Status: DC | PRN

## 2019-04-26 NOTE — Progress Notes (Signed)
Subjective:     Patient ID: Lisa Faulkner , female    DOB: 08/23/1995 , 23 y.o.   MRN: [id number]   Chief Complaint  Patient presents with  . Rash    patient stated she has a rash on her back that appeared about a week ago  . Abdominal Pain    patient stated her stomach and back has been started hurting for the past couple of days     HPI  History of PCOS.    Rash This is a new problem. The current episode started in the past 7 days. Progression since onset: on her back, last night began welping and started on her chest woke up this morning not on her chest.  The affected locations include the back. Rash characteristics: whelping. Pertinent negatives include no congestion, cough, fever or sore throat. Past treatments include nothing. The treatment provided no relief.  Abdominal Pain This is a new problem. The current episode started in the past 7 days (2 days ago). The onset quality is sudden. The problem occurs constantly. The problem has been unchanged. Pain location: bilateral upper quadrant however left is worse. Pain severity now: she took 2 amoxicillin yesterday and one this morning. for diverticulitis. Pertinent negatives include no constipation, fever, frequency or headaches. Nothing aggravates the pain. The pain is relieved by nothing. She has tried nothing for the symptoms. The treatment provided no relief. There is no history of abdominal surgery.     Past Medical History:  Diagnosis Date  . BMI 40.0-44.9, adult (HCC)   . History of preterm premature rupture of membranes (PPROM)    @ 16wks. G1 pregnancy  . Hypertension   . PCOS (polycystic ovarian syndrome)   . Prediabetes      Family History  Problem Relation Age of Onset  . Hypertension Mother   . Hypertension Father   . Hypertension Brother   . Heart disease Brother   . Stroke Brother   . Stroke Brother 31  . Irritable bowel syndrome Sister   . Diabetes Maternal Grandmother      Current Outpatient  Medications:  .  amoxicillin (AMOXIL) 500 MG capsule, Take 1 capsule (500 mg total) by mouth 3 (three) times daily., Disp: 21 capsule, Rfl: 0   No Known Allergies   Review of Systems  Constitutional: Negative for fever.  HENT: Negative for congestion and sore throat.   Respiratory: Negative for cough.   Cardiovascular: Negative.  Negative for chest pain, palpitations and leg swelling.  Gastrointestinal: Positive for abdominal pain. Negative for constipation.  Genitourinary: Negative for frequency.  Skin: Positive for rash.  Neurological: Negative for headaches.  Psychiatric/Behavioral: Negative.      Today's Vitals   04/26/19 1107  BP: 120/76  Pulse: 90  Temp: 98.3 F (36.8 C)  TempSrc: Oral  Weight: (!) 329 lb 6.4 oz (149.4 kg)  Height: 5' 6.8" (1.697 m)  PainSc: 4    Body mass index is 51.9 kg/m.   Objective:  Physical Exam Constitutional:      General: She is not in acute distress.    Appearance: She is well-developed. She is obese.  Abdominal:     General: Bowel sounds are normal.     Palpations: Abdomen is soft.  Skin:    General: Skin is warm and dry.  Neurological:     General: No focal deficit present.     Mental Status: She is alert.  Psychiatric:        Mood and Affect:  Mood normal. Mood is not anxious.        Behavior: Behavior normal.         Assessment And Plan:     1. Need for influenza vaccination  Influenza vaccine given in office  Advised to take Tylenol as needed for muscle aches or fever - Flu Vaccine QUAD 6+ mos PF IM (Fluarix Quad PF)  2. Frequent urination  Trace leukocytes  Will send urinalysis for urine culture - POCT Urinalysis Dipstick (81002) - Culture, Urine  3. Muscle spasm  - cyclobenzaprine (FLEXERIL) 10 MG tablet; Take 1 tablet (10 mg total) by mouth 3 (three) times daily as needed for muscle spasms.  Dispense: 30 tablet; Refill: 0  4. Generalized abdominal pain  I feel she is having reflux  Encouraged to  increase her water intake and increase her fiber intake  She is to take omeprazole daily and avoid foods high in fat and spicy - omeprazole (PRILOSEC) 20 MG capsule; Take 1 capsule (20 mg total) by mouth daily.  Dispense: 30 capsule; Refill: 1  5. Missed period  Negative urine pregnancy test - POCT Urine Pregnancy  6. Rash and nonspecific skin eruption  Scattered whelp like rash to chest concerning for possible histamine response  She is to take the omeprazole which may help as well.  Minette Brine, FNP    THE PATIENT IS ENCOURAGED TO PRACTICE SOCIAL DISTANCING DUE TO THE COVID-19 PANDEMIC.

## 2019-04-26 NOTE — Patient Instructions (Addendum)
Probiotic daily over the counter  Return in 6 weeks for follow up abdominal discomfort.   Back Exercises These exercises help to make your trunk and back strong. They also help to keep the lower back flexible. Doing these exercises can help to prevent back pain or lessen existing pain.  If you have back pain, try to do these exercises 2-3 times each day or as told by your doctor.  As you get better, do the exercises once each day. Repeat the exercises more often as told by your doctor.  To stop back pain from coming back, do the exercises once each day, or as told by your doctor. Exercises Single knee to chest Do these steps 3-5 times in a row for each leg: 1. Lie on your back on a firm bed or the floor with your legs stretched out. 2. Bring one knee to your chest. 3. Grab your knee or thigh with both hands and hold them it in place. 4. Pull on your knee until you feel a gentle stretch in your lower back or buttocks. 5. Keep doing the stretch for 10-30 seconds. 6. Slowly let go of your leg and straighten it. Pelvic tilt Do these steps 5-10 times in a row: 1. Lie on your back on a firm bed or the floor with your legs stretched out. 2. Bend your knees so they point up to the ceiling. Your feet should be flat on the floor. 3. Tighten your lower belly (abdomen) muscles to press your lower back against the floor. This will make your tailbone point up to the ceiling instead of pointing down to your feet or the floor. 4. Stay in this position for 5-10 seconds while you gently tighten your muscles and breathe evenly. Cat-cow Do these steps until your lower back bends more easily: 1. Get on your hands and knees on a firm surface. Keep your hands under your shoulders, and keep your knees under your hips. You may put padding under your knees. 2. Let your head hang down toward your chest. Tighten (contract) the muscles in your belly. Point your tailbone toward the floor so your lower back  becomes rounded like the back of a cat. 3. Stay in this position for 5 seconds. 4. Slowly lift your head. Let the muscles of your belly relax. Point your tailbone up toward the ceiling so your back forms a sagging arch like the back of a cow. 5. Stay in this position for 5 seconds.  Press-ups Do these steps 5-10 times in a row: 1. Lie on your belly (face-down) on the floor. 2. Place your hands near your head, about shoulder-width apart. 3. While you keep your back relaxed and keep your hips on the floor, slowly straighten your arms to raise the top half of your body and lift your shoulders. Do not use your back muscles. You may change where you place your hands in order to make yourself more comfortable. 4. Stay in this position for 5 seconds. 5. Slowly return to lying flat on the floor.  Bridges Do these steps 10 times in a row: 1. Lie on your back on a firm surface. 2. Bend your knees so they point up to the ceiling. Your feet should be flat on the floor. Your arms should be flat at your sides, next to your body. 3. Tighten your butt muscles and lift your butt off the floor until your waist is almost as high as your knees. If you do not  feel the muscles working in your butt and the back of your thighs, slide your feet 1-2 inches farther away from your butt. 4. Stay in this position for 3-5 seconds. 5. Slowly lower your butt to the floor, and let your butt muscles relax. If this exercise is too easy, try doing it with your arms crossed over your chest. Belly crunches Do these steps 5-10 times in a row: 1. Lie on your back on a firm bed or the floor with your legs stretched out. 2. Bend your knees so they point up to the ceiling. Your feet should be flat on the floor. 3. Cross your arms over your chest. 4. Tip your chin a little bit toward your chest but do not bend your neck. 5. Tighten your belly muscles and slowly raise your chest just enough to lift your shoulder blades a tiny bit off  of the floor. Avoid raising your body higher than that, because it can put too much stress on your low back. 6. Slowly lower your chest and your head to the floor. Back lifts Do these steps 5-10 times in a row: 1. Lie on your belly (face-down) with your arms at your sides, and rest your forehead on the floor. 2. Tighten the muscles in your legs and your butt. 3. Slowly lift your chest off of the floor while you keep your hips on the floor. Keep the back of your head in line with the curve in your back. Look at the floor while you do this. 4. Stay in this position for 3-5 seconds. 5. Slowly lower your chest and your face to the floor. Contact a doctor if:  Your back pain gets a lot worse when you do an exercise.  Your back pain does not get better 2 hours after you exercise. If you have any of these problems, stop doing the exercises. Do not do them again unless your doctor says it is okay. Get help right away if:  You have sudden, very bad back pain. If this happens, stop doing the exercises. Do not do them again unless your doctor says it is okay. This information is not intended to replace advice given to you by your health care provider. Make sure you discuss any questions you have with your health care provider. Document Released: 08/24/2010 Document Revised: 04/16/2018 Document Reviewed: 04/16/2018 [employer]vier Patient Education  2020 [employer].

## 2019-05-18 ENCOUNTER — Other Ambulatory Visit: Payer: Self-pay | Admitting: Nurse Practitioner

## 2019-05-18 DIAGNOSIS — R1084 Generalized abdominal pain: Secondary | ICD-10-CM

## 2019-06-05 ENCOUNTER — Other Ambulatory Visit: Payer: Self-pay | Admitting: Internal Medicine

## 2019-06-05 DIAGNOSIS — R1084 Generalized abdominal pain: Secondary | ICD-10-CM

## 2019-12-07 ENCOUNTER — Ambulatory Visit: Payer: 59 | Admitting: Sports Medicine

## 2020-01-27 ENCOUNTER — Encounter: Payer: Self-pay | Admitting: Internal Medicine

## 2020-07-13 ENCOUNTER — Ambulatory Visit: Payer: Self-pay | Admitting: Obstetrics and Gynecology

## 2020-08-10 ENCOUNTER — Encounter: Payer: Self-pay | Admitting: Obstetrics & Gynecology

## 2020-08-10 ENCOUNTER — Other Ambulatory Visit (HOSPITAL_COMMUNITY)
Admission: RE | Admit: 2020-08-10 | Discharge: 2020-08-10 | Disposition: A | Discharge status: Home / Self Care | Payer: 59 | Source: Ambulatory Visit | Attending: Obstetrics & Gynecology | Admitting: Obstetrics & Gynecology

## 2020-08-10 ENCOUNTER — Other Ambulatory Visit: Payer: Self-pay

## 2020-08-10 ENCOUNTER — Ambulatory Visit (INDEPENDENT_AMBULATORY_CARE_PROVIDER_SITE_OTHER): Payer: 59 | Admitting: Obstetrics & Gynecology

## 2020-08-10 VITALS — BP 145/96 | HR 78 | Ht 66.0 in | Wt 335.6 lb

## 2020-08-10 DIAGNOSIS — A5901 Trichomonal vulvovaginitis: Secondary | ICD-10-CM

## 2020-08-10 DIAGNOSIS — R32 Unspecified urinary incontinence: Secondary | ICD-10-CM | POA: Diagnosis not present

## 2020-08-10 DIAGNOSIS — Z01419 Encounter for gynecological examination (general) (routine) without abnormal findings: Secondary | ICD-10-CM

## 2020-08-10 DIAGNOSIS — R351 Nocturia: Secondary | ICD-10-CM

## 2020-08-10 DIAGNOSIS — Z23 Encounter for immunization: Secondary | ICD-10-CM | POA: Diagnosis not present

## 2020-08-10 DIAGNOSIS — N6310 Unspecified lump in the right breast, unspecified quadrant: Secondary | ICD-10-CM

## 2020-08-10 DIAGNOSIS — N6320 Unspecified lump in the left breast, unspecified quadrant: Secondary | ICD-10-CM

## 2020-08-10 DIAGNOSIS — Z113 Encounter for screening for infections with a predominantly sexual mode of transmission: Secondary | ICD-10-CM | POA: Diagnosis not present

## 2020-08-10 NOTE — Patient Instructions (Signed)
Breast Cyst  A breast cyst is a sac in the breast that is filled with fluid. They are usually noncancerous (benign) and are common among women. Breast cysts are most often in the upper, outer portion of the breast. One or more cysts may develop. They form when fluid builds up inside the breast glands. There are several types of breast cysts. Some are too small to feel, but these can be seen with imaging tests such as an X-ray of the breast (mammogram) or ultrasound. Breast cysts do not increase your risk of breast cancer. They usually disappear after you no longer have a menstrual cycle (after menopause), unless you take artificial hormones (are on hormone therapy). What are the causes? This condition may be caused by:  Blockage of tubes (ducts) in the breast glands, which leads to fluid buildup. Duct blockage may result from: ? Fibrocystic breast changes. This is a common, benign condition that occurs when women go through hormonal changes during the menstrual cycle. This is a common cause of multiple breast cysts. ? Overgrowth of breast tissue or breast glands. ? Scar tissue in the breast from previous surgery.  Changes in certain female hormones (estrogen and progesterone). The exact cause of this condition is not known. What increases the risk? You may be more likely to develop breast cysts if you have not gone through menopause. What are the signs or symptoms? Symptoms of this condition include:  Feeling one or more smooth, round, soft lumps (like grapes) in the breast that are easily movable. The lump or lumps may get bigger and more painful before your menstrual period and get smaller after your menstrual period.  Breast discomfort or pain. How is this diagnosed? This condition may be diagnosed based on:  A physical exam. A cyst can be felt by your health care provider during this exam.  Imaging tests, such as mammogram or ultrasound. Fluid may be removed from the cyst with a  needle (fine-needle aspiration) and tested to make sure the cyst is not cancerous. How is this treated? Treatment may not be needed for this condition. Your health care provider may monitor the cyst to see if it goes away on its own. If the cyst is uncomfortable or gets bigger, or if you do not like how the cyst makes your breast look, you may need treatment. Treatment may include:  Hormone therapy.  Fine-needle aspiration to drain fluid from the cyst. There is a chance of the cyst coming back (recurring) after aspiration.  Surgery to remove the cyst. Follow these instructions at home: Self-exams   Do a breast self-exam every month, or as often as directed. A breast self-exam involves: ? Comparing your breasts in the mirror. ? Looking for visible changes in your skin or nipples. ? Feeling for lumps or changes.  Having many breast cysts may make it harder to feel for new lumps. Understand how your breasts normally look and feel, and write down any changes in your breasts. Tell your health care provider about any changes. Eating and drinking  Follow instructions from your health care provider about eating and drinking restrictions.  Drink enough fluid to keep your urine pale yellow.  Avoid caffeine.  Cut down on salt (sodium) in what you eat and drink, especially before your menstrual period. Too much sodium can cause fluid buildup, breast swelling, and discomfort. General instructions  See your health care provider regularly. ? Get a yearly physical exam. ? If you are 58-67 years old, get a  clinical breast exam every 1-3 years. After the age of 17 years, get this exam every year. ? Get mammograms as often as directed.  Take over-the-counter and prescription medicines only as told by your health care provider.  Wear a supportive bra, especially when exercising.  Keep all follow-up visits as told by your health care provider. This is important. Contact a health care provider  if:  You feel, or think you feel, a lump in your breast.  You notice that both breasts look or feel different than usual.  Your breast is still causing pain after your menstrual period is over.  You find new lumps or bumps that were not there before.  You feel lumps in your armpit. Get help right away if:  You have severe pain, tenderness, redness, or warmth in your breast.  You have fluid or blood leaking from your nipple.  Your breast lump becomes hard and painful.  You notice dimpling or wrinkling of the breast or nipple. Summary  A breast cyst is a sac in the breast that is filled with fluid.  Treatment may not be needed for this condition.  If the cyst is uncomfortable or gets bigger, or if you do not like how the cyst makes your breast look, you may need treatment. This information is not intended to replace advice given to you by your health care provider. Make sure you discuss any questions you have with your health care provider. Document Revised: 12/08/2018 Document Reviewed: 12/08/2018 Elsevier Patient Education  [place] is a breast tumor that is not cancerous (is benign). These tumors are made up of breast tissue and the tissue that holds breast tissue together (connective tissue). There are several types:  Simple fibroadenoma. This is the most common type. It contains only one type of tissue.  Complex fibroadenoma. This type contains more than one kind of tissue or irregular tissue, such as pockets of fluid (cysts) or deposits of calcium (calcifications) in the breast.  Juvenile fibroadenoma. This is a type of tumor that can develop in adolescent girls. It tends to grow larger over time than other adenomas. Although fibroadenomas are not cancerous, having a fibroadenoma may slightly increase your risk for developing breast cancer in the future. What are the causes? The cause of fibroadenoma is not known. What increases  the risk? This condition is more likely to develop in:  Women who are 10-61 years old.  Women of African American descent. What are the signs or symptoms? You might have no symptoms. Some fibroadenomas are too small to feel. If you can feel it, it may feel like a lump in your breast that is:  Firm.  Round.  Smooth.  Slightly movable. A fibroadenoma usually occurs as a single lump, but sometimes there may be more than one lump. They can occur in one breast or in both breasts. Fibroadenomas vary in size. They usually do not cause pain unless they grow to a large size. How is this diagnosed? This condition may be diagnosed based on:  Your symptoms and medical history.  A physical exam. You may notice a lump during a breast self-exam, or your health care provider may notice it during a routine breast exam or breast X-ray (mammogram).  An ultrasound to check for fluid inside the lump (cystic tumor). If there is fluid, some fluid may be removed with a needle and examined under a microscope.  A mammogram to examine a lump that does not contain  fluid (solid). Depending on mammogram results, you may need to have a procedure to remove a tissue sample from the lump using a needle (breast biopsy). The tissue will be examined under a microscope. How is this treated? Treatment for this condition may include:  Having clinical breast exams regularly to check for changes in your fibroadenoma.  Having the fibroadenoma removed. A fibroadenoma may be removed if it is: ? Large. ? Continuing to grow. ? Causing symptoms, such as pain or a change in the appearance of your breast. ? A juvenile fibroadenoma. These tend to grow large over time. Follow these instructions at home:   Do breast self-exams at home as told by your health care provider. Monitor your fibroadenoma, the skin of your breasts, and your nipples for any changes.  Do not use any products that contain nicotine or tobacco, such as  cigarettes and e-cigarettes. These can further increase your cancer risk. If you need help quitting, ask your health care provider.  Keep all follow-up visits as told by your health care provider. This is important. You will need breast exams on a regular basis. Contact a health care provider if:  Your fibroadenoma: ? Gets larger. ? Feels different. ? Becomes painful.  You find a new breast lump.  You have any changes in your breast skin, such as: ? Dimpling. ? Bruising. ? Thickening. ? Redness.  You have any changes in your nipple, such as: ? Fluid leaking from a nipple. ? Redness. Summary  Fibroadenoma is a breast tumor that is not cancerous (is benign) and is made up of breast tissue and the tissue that holds breast tissue together (connective tissue).  Although fibroadenomas are not cancerous, having a fibroadenoma may slightly increase your risk for developing breast cancer in the future.  A fibroadenoma may feel like a lump in your breast. It is usually firm, round, smooth, and slightly movable. Some fibroadenomas are too small to be felt.  Do breast self-exams at home as told by your health care provider. Monitor your fibroadenoma, the skin of your breasts, and your nipples for any changes. This information is not intended to replace advice given to you by your health care provider. Make sure you discuss any questions you have with your health care provider. Document Revised: 08/01/2017 Document Reviewed: 07/22/2017 Elsevier Patient Education  [place].   Urinary Frequency, Adult Urinary frequency means urinating more often than usual. You may urinate every 1-2 hours even though you drink a normal amount of fluid and do not have a bladder infection or condition. Although you urinate more often than normal, the total amount of urine produced in a day is normal. With urinary frequency, you may have an urgent need to urinate often. The stress and anxiety of needing  to find a bathroom quickly can make this urge worse. This condition may go away on its own or you may need treatment at home. Home treatment may include bladder training, exercises, taking medicines, or making changes to your diet. Follow these instructions at home: Bladder health   Keep a bladder diary if told by your health care provider. Keep track of: ? What you eat and drink. ? How often you urinate. ? How much you urinate.  Follow a bladder training program if told by your health care provider. This may include: ? Learning to delay going to the bathroom. ? Double urinating (voiding). This helps if you are not completely emptying your bladder. ? Scheduled voiding.  Do Kegel  exercises as told by your health care provider. Kegel exercises strengthen the muscles that help control urination, which may help the condition. Eating and drinking  If told by your health care provider, make diet changes, such as: ? Avoiding caffeine. ? Drinking fewer fluids, especially alcohol. ? Not drinking in the evening. ? Avoiding foods or drinks that may irritate the bladder. These include coffee, tea, soda, artificial sweeteners, citrus, tomato-based foods, and chocolate. ? Eating foods that help prevent or ease constipation. Constipation can make this condition worse. Your health care provider may recommend that you:  Drink enough fluid to keep your urine pale yellow.  Take over-the-counter or prescription medicines.  Eat foods that are high in fiber, such as beans, whole grains, and fresh fruits and vegetables.  Limit foods that are high in fat and processed sugars, such as fried or sweet foods. General instructions  Take over-the-counter and prescription medicines only as told by your health care provider.  Keep all follow-up visits as told by your health care provider. This is important. Contact a health care provider if:  You start urinating more often.  You feel pain or irritation when  you urinate.  You notice blood in your urine.  Your urine looks cloudy.  You develop a fever.  You begin vomiting. Get help right away if:  You are unable to urinate. Summary  Urinary frequency means urinating more often than usual. With urinary frequency, you may urinate every 1-2 hours even though you drink a normal amount of fluid and do not have a bladder infection or other bladder condition.  Your health care provider may recommend that you keep a bladder diary, follow a bladder training program, or make dietary changes.  If told by your health care provider, do Kegel exercises to strengthen the muscles that help control urination.  Take over-the-counter and prescription medicines only as told by your health care provider.  Contact a health care provider if your symptoms do not improve or get worse. This information is not intended to replace advice given to you by your health care provider. Make sure you discuss any questions you have with your health care provider. Document Revised: 01/29/2018 Document Reviewed: 01/29/2018 Elsevier Patient Education  [place].   Urinary Incontinence  Urinary incontinence refers to a condition in which a person is unable to control where and when to pass urine. A person with this condition will urinate when he or she does not mean to (involuntarily). What are the causes? This condition may be caused by:  Medicines.  Infections.  Constipation.  Overactive bladder muscles.  Weak bladder muscles.  Weak pelvic floor muscles. These muscles provide support for the bladder, intestine, and, in women, the uterus.  Enlarged prostate in men. The prostate is a gland near the bladder. When it gets too big, it can pinch the urethra. With the urethra blocked, the bladder can weaken and lose the ability to empty properly.  Surgery.  Emotional factors, such as anxiety, stress, or post-traumatic stress disorder (PTSD).  Pelvic organ  prolapse. This happens in women when organs shift out of place and into the vagina. This shift can prevent the bladder and urethra from working properly. What increases the risk? The following factors may make you more likely to develop this condition:  Older age.  Obesity and physical inactivity.  Pregnancy and childbirth.  Menopause.  Diseases that affect the nerves or spinal cord (neurological diseases).  Long-term (chronic) coughing. This can increase pressure on  the bladder and pelvic floor muscles. What are the signs or symptoms? Symptoms may vary depending on the type of urinary incontinence you have. They include:  A sudden urge to urinate, but passing urine involuntarily before you can get to a bathroom (urge incontinence).  Suddenly passing urine with any activity that forces urine to pass, such as coughing, laughing, exercise, or sneezing (stress incontinence).  Needing to urinate often, but urinating only a small amount, or constantly dribbling urine (overflow incontinence).  Urinating because you cannot get to the bathroom in time due to a physical disability, such as arthritis or injury, or communication and thinking problems, such as Alzheimer disease (functional incontinence). How is this diagnosed? This condition may be diagnosed based on:  Your medical history.  A physical exam.  Tests, such as: ? Urine tests. ? X-rays of your kidney and bladder. ? Ultrasound. ? CT scan. ? Cystoscopy. In this procedure, a health care provider inserts a tube with a light and camera (cystoscope) through the urethra and into the bladder in order to check for problems. ? Urodynamic testing. These tests assess how well the bladder, urethra, and sphincter can store and release urine. There are different types of urodynamic tests, and they vary depending on what the test is measuring. To help diagnose your condition, your health care provider may recommend that you keep a log of when  you urinate and how much you urinate. How is this treated? Treatment for this condition depends on the type of incontinence that you have and its cause. Treatment may include:  Lifestyle changes, such as: ? Quitting smoking. ? Maintaining a healthy weight. ? Staying active. Try to get 150 minutes of moderate-intensity exercise every week. Ask your health care provider which activities are safe for you. ? Eating a healthy diet.  Avoid high-fat foods, like fried foods.  Avoid refined carbohydrates like white bread and white rice.  Limit how much alcohol and caffeine you drink.  Increase your fiber intake. Foods such as fresh fruits, vegetables, beans, and whole grains are healthy sources of fiber.  Pelvic floor muscle exercises.  Bladder training, such as lengthening the amount of time between bathroom breaks, or using the bathroom at regular intervals.  Using techniques to suppress bladder urges. This can include distraction techniques or controlled breathing exercises.  Medicines to relax the bladder muscles and prevent bladder spasms.  Medicines to help slow or prevent the growth of a man's prostate.  Botox injections. These can help relax the bladder muscles.  Using pulses of electricity to help change bladder reflexes (electrical nerve stimulation).  For women, using a medical device to prevent urine leaks. This is a small, tampon-like, disposable device that is inserted into the urethra.  Injecting collagen or carbon beads (bulking agents) into the urinary sphincter. These can help thicken tissue and close the bladder opening.  Surgery. Follow these instructions at home: Lifestyle  Limit alcohol and caffeine. These can fill your bladder quickly and irritate it.  Keep yourself clean to help prevent odors and skin damage. Ask your doctor about special skin creams and cleansers that can protect the skin from urine.  Consider wearing pads or adult diapers. Make sure to  change them regularly, and always change them right after experiencing incontinence. General instructions  Take over-the-counter and prescription medicines only as told by your health care provider.  Use the bathroom about every 3-4 hours, even if you do not feel the need to urinate. Try to empty your bladder  completely every time. After urinating, wait a minute. Then try to urinate again.  Make sure you are in a relaxed position while urinating.  If your incontinence is caused by nerve problems, keep a log of the medicines you take and the times you go to the bathroom.  Keep all follow-up visits as told by your health care provider. This is important. Contact a health care provider if:  You have pain that gets worse.  Your incontinence gets worse. Get help right away if:  You have a fever or chills.  You are unable to urinate.  You have redness in your groin area or down your legs. Summary  Urinary incontinence refers to a condition in which a person is unable to control where and when to pass urine.  This condition may be caused by medicines, infection, weak bladder muscles, weak pelvic floor muscles, enlargement of the prostate (in men), or surgery.  The following factors increase your risk for developing this condition: older age, obesity, pregnancy and childbirth, menopause, neurological diseases, and chronic coughing.  There are several types of urinary incontinence. They include urge incontinence, stress incontinence, overflow incontinence, and functional incontinence.  This condition is usually treated first with lifestyle and behavioral changes, such as quitting smoking, eating a healthier diet, and doing regular pelvic floor exercises. Other treatment options include medicines, bulking agents, medical devices, electrical nerve stimulation, or surgery. This information is not intended to replace advice given to you by your health care provider. Make sure you discuss any  questions you have with your health care provider. Document Revised: 08/01/2017 Document Reviewed: 10/31/2016 Elsevier Patient Education  [place] 93-50 Years Old, Female Preventive care refers to visits with your health care provider and lifestyle choices that can promote health and wellness. This includes:  A yearly physical exam. This may also be called an annual well check.  Regular dental visits and eye exams.  Immunizations.  Screening for certain conditions.  Healthy lifestyle choices, such as eating a healthy diet, getting regular exercise, not using drugs or products that contain nicotine and tobacco, and limiting alcohol use. What can I expect for my preventive care visit? Physical exam Your health care provider will check your:  Height and weight. This may be used to calculate body mass index (BMI), which tells if you are at a healthy weight.  Heart rate and blood pressure.  Skin for abnormal spots. Counseling Your health care provider may ask you questions about your:  Alcohol, tobacco, and drug use.  Emotional well-being.  Home and relationship well-being.  Sexual activity.  Eating habits.  Work and work Statistician.  Method of birth control.  Menstrual cycle.  Pregnancy history. What immunizations do I need?  Influenza (flu) vaccine  This is recommended every year. Tetanus, diphtheria, and pertussis (Tdap) vaccine  You may need a Td booster every 10 years. Varicella (chickenpox) vaccine  You may need this if you have not been vaccinated. Human papillomavirus (HPV) vaccine  If recommended by your health care provider, you may need three doses over 6 months. Measles, mumps, and rubella (MMR) vaccine  You may need at least one dose of MMR. You may also need a second dose. Meningococcal conjugate (MenACWY) vaccine  One dose is recommended if you are age 38-21 years and a first-year college student living in a  residence hall, or if you have one of several medical conditions. You may also need additional booster doses. Pneumococcal conjugate (PCV13) vaccine  You may need this if you have certain conditions and were not previously vaccinated. Pneumococcal polysaccharide (PPSV23) vaccine  You may need one or two doses if you smoke cigarettes or if you have certain conditions. Hepatitis A vaccine  You may need this if you have certain conditions or if you travel or work in places where you may be exposed to hepatitis A. Hepatitis B vaccine  You may need this if you have certain conditions or if you travel or work in places where you may be exposed to hepatitis B. Haemophilus influenzae type b (Hib) vaccine  You may need this if you have certain conditions. You may receive vaccines as individual doses or as more than one vaccine together in one shot (combination vaccines). Talk with your health care provider about the risks and benefits of combination vaccines. What tests do I need?  Blood tests  Lipid and cholesterol levels. These may be checked every 5 years starting at age 20.  Hepatitis C test.  Hepatitis B test. Screening  Diabetes screening. This is done by checking your blood sugar (glucose) after you have not eaten for a while (fasting).  Sexually transmitted disease (STD) testing.  BRCA-related cancer screening. This may be done if you have a family history of breast, ovarian, tubal, or peritoneal cancers.  Pelvic exam and Pap test. This may be done every 3 years starting at age 7. Starting at age 2, this may be done every 5 years if you have a Pap test in combination with an HPV test. Talk with your health care provider about your test results, treatment options, and if necessary, the need for more tests. Follow these instructions at home: Eating and drinking   Eat a diet that includes fresh fruits and vegetables, whole grains, lean protein, and low-fat dairy.  Take vitamin  and mineral supplements as recommended by your health care provider.  Do not drink alcohol if: ? Your health care provider tells you not to drink. ? You are pregnant, may be pregnant, or are planning to become pregnant.  If you drink alcohol: ? Limit how much you have to 0-1 drink a day. ? Be aware of how much alcohol is in your drink. In the [place] one drink equals one 12 oz bottle of beer (355 mL), one 5 oz glass of wine (148 mL), or one 1 oz glass of hard liquor (44 mL). Lifestyle  Take daily care of your teeth and gums.  Stay active. Exercise for at least 30 minutes on 5 or more days each week.  Do not use any products that contain nicotine or tobacco, such as cigarettes, e-cigarettes, and chewing tobacco. If you need help quitting, ask your health care provider.  If you are sexually active, practice safe sex. Use a condom or other form of birth control (contraception) in order to prevent pregnancy and STIs (sexually transmitted infections). If you plan to become pregnant, see your health care provider for a preconception visit. What's next?  Visit your health care provider once a year for a well check visit.  Ask your health care provider how often you should have your eyes and teeth checked.  Stay up to date on all vaccines. This information is not intended to replace advice given to you by your health care provider. Make sure you discuss any questions you have with your health care provider. Document Revised: 04/02/2018 Document Reviewed: 04/02/2018 Elsevier Patient Education  2020 [employer].

## 2020-08-10 NOTE — Progress Notes (Signed)
GYNECOLOGY ANNUAL PREVENTATIVE CARE ENCOUNTER NOTE  History:     Lisa Faulkner is a 25 y.o. G26P0010 female here for a routine annual gynecologic exam.  Current complaints: urinary incontinence when coughing/sneezing also noticed excessive urination at night. No dysuria. This has been going on for several months.  Also recently noted tender lumps in both breasts. No FH of breast cancer but she and her family are very concerned.  Denies abnormal vaginal bleeding (actually reported regular lighter periods), discharge, pelvic pain, problems with intercourse or other gynecologic concerns.    Gynecologic History No LMP recorded. (Menstrual status: Irregular Periods). Contraception: none Last Pap: 12/17/2016. Results were: normal. Obstetric History OB History  Gravida Para Term Preterm AB Living  1 1   0   0  SAB IAB Ectopic Multiple Live Births        0 1    # Outcome Date GA Lbr Len/2nd Weight Sex Delivery Anes PTL Lv  1 Para 01/03/17 [redacted]w[redacted]d 01:29  M Vag-Spont None  ND    Past Medical History:  Diagnosis Date  . BMI 40.0-44.9, adult (HCC)   . History of preterm premature rupture of membranes (PPROM)    @ 16wks. G1 pregnancy  . Hypertension   . PCOS (polycystic ovarian syndrome)   . Prediabetes     Past Surgical History:  Procedure Laterality Date  . KNEE SURGERY    . WISDOM TOOTH EXTRACTION      Current Outpatient Medications on File Prior to Visit  Medication Sig Dispense Refill  . tetrahydrozoline 0.05 % ophthalmic solution      No current facility-administered medications on file prior to visit.   No Known Allergies  Social History:  reports that she has never smoked. She has never used smokeless tobacco. She reports that she does not drink alcohol and does not use drugs.  Family History  Problem Relation Age of Onset  . Hypertension Mother   . Hypertension Father   . Hypertension Brother   . Heart disease Brother   . Stroke Brother   . Stroke Brother 37  .  Irritable bowel syndrome Sister   . Diabetes Maternal Grandmother     The following portions of the patient's history were reviewed and updated as appropriate: allergies, current medications, past family history, past medical history, past social history, past surgical history and problem list.  Review of Systems Pertinent items noted in HPI and remainder of comprehensive ROS otherwise negative.  Physical Exam:  BP (!) 145/96   Pulse 78   Ht 5\' 6"  (1.676 m)   Wt (!) 335 lb 9.6 oz (152.2 kg)   BMI 54.17 kg/m  CONSTITUTIONAL: Well-developed, well-nourished female in no acute distress.  HENT:  Normocephalic, atraumatic, External right and left ear normal.  EYES: Conjunctivae and EOM are normal. Pupils are equal, round, and reactive to light. No scleral icterus.  NECK: Normal range of motion, supple, no masses.  Normal thyroid.  SKIN: Skin is warm and dry. No rash noted. Not diaphoretic. No erythema. No pallor. MUSCULOSKELETAL: Normal range of motion. No tenderness.  No cyanosis, clubbing, or edema.   NEUROLOGIC: Alert and oriented to person, place, and time. Normal reflexes, muscle tone coordination.  PSYCHIATRIC: Normal mood and affect. Normal behavior. Normal judgment and thought content. CARDIOVASCULAR: Normal heart rate noted, regular rhythm RESPIRATORY: Clear to auscultation bilaterally. Effort and breath sounds normal, no problems with respiration noted. BREASTS: Symmetric in size. Small subcentimeter masses palpated in upper inner quadrants of both  breasts, some tenderness.  No other masses, tenderness, skin changes, nipple drainage, or lymphadenopathy bilaterally. Performed in the presence of a chaperone. ABDOMEN: Soft, no distention noted.  No tenderness, rebound or guarding.  PELVIC: Normal appearing external genitalia and urethral meatus; normal appearing vaginal mucosa and cervix. Used long Pedersen speculum for visualization.  No abnormal discharge noted.  Pap smear obtained.   Not able to palpate uterus well secondary to habitus. No uterine or adnexal tenderness.  Performed in the presence of a chaperone.   Assessment and Plan:      1. Bilateral breast lumps Likely fibroadenoma vs fibrocystic breasts. Will get imaging and follow up recommendations - MM DIAG BREAST TOMO BILATERAL; Future - US BREAST LTD UNI LEFT INC AXILLA; Future - US BREAST LTD UNI RIGHT INC AXILLA; Future  2. Excessive urination at night 3. Urinary incontinence in female Labs checked for infection, DM, will follow up results and manage accordingly. Incontinence is also likely due to BMI, will continue to monitor closely. If worsens without any other etiology, may need to send to Urology. - Urinalysis, Routine w reflex microscopic - Urine Culture - Hemoglobin A1c  4. Well woman exam with routine gynecological exam - Tdap vaccine greater than or equal to 7yo IM - Cytology - PAP - Hepatitis C antibody - Hepatitis B surface antigen - HIV Antibody (routine testing w rflx) - RPR - Hemoglobin A1c - CBC - Comprehensive metabolic panel - Lipid panel - TSH Will follow up results of pap smear and labs and manage accordingly. Routine preventative health maintenance measures emphasized. Please refer to After Visit Summary for other counseling recommendations.      Jaynie Collins, MD, FACOG Obstetrician & Gynecologist, [facility] for [employer], [facility]

## 2020-08-11 LAB — CBC
Hematocrit: 37 % (ref 34.0–46.6)
Hemoglobin: 12.1 g/dL (ref 11.1–15.9)
MCH: 28 pg (ref 26.6–33.0)
MCHC: 32.7 g/dL (ref 31.5–35.7)
MCV: 86 fL (ref 79–97)
Platelets: 320 10*3/uL (ref 150–450)
RBC: 4.32 x10E6/uL (ref 3.77–5.28)
RDW: 14.5 % (ref 11.7–15.4)
WBC: 11.3 10*3/uL — ABNORMAL HIGH (ref 3.4–10.8)

## 2020-08-11 LAB — HIV ANTIBODY (ROUTINE TESTING W REFLEX): HIV Screen 4th Generation wRfx: NONREACTIVE

## 2020-08-11 LAB — COMPREHENSIVE METABOLIC PANEL
ALT: 12 IU/L (ref 0–32)
AST: 8 IU/L (ref 0–40)
Albumin/Globulin Ratio: 1.1 — ABNORMAL LOW (ref 1.2–2.2)
Albumin: 4.3 g/dL (ref 3.9–5.0)
Alkaline Phosphatase: 85 IU/L (ref 44–121)
BUN/Creatinine Ratio: 12 (ref 9–23)
BUN: 11 mg/dL (ref 6–20)
Bilirubin Total: 0.3 mg/dL (ref 0.0–1.2)
CO2: 19 mmol/L — ABNORMAL LOW (ref 20–29)
Calcium: 9.4 mg/dL (ref 8.7–10.2)
Chloride: 102 mmol/L (ref 96–106)
Creatinine, Ser: 0.89 mg/dL (ref 0.57–1.00)
GFR calc Af Amer: 104 mL/min/{1.73_m2} (ref >59)
GFR calc non Af Amer: 90 mL/min/{1.73_m2} (ref >59)
Globulin, Total: 3.9 g/dL (ref 1.5–4.5)
Glucose: 92 mg/dL (ref 65–99)
Potassium: 4.3 mmol/L (ref 3.5–5.2)
Sodium: 137 mmol/L (ref 134–144)
Total Protein: 8.2 g/dL (ref 6.0–8.5)

## 2020-08-11 LAB — URINALYSIS, ROUTINE W REFLEX MICROSCOPIC
Bilirubin, UA: NEGATIVE
Glucose, UA: NEGATIVE
Ketones, UA: NEGATIVE
Nitrite, UA: NEGATIVE
Protein,UA: NEGATIVE
Specific Gravity, UA: 1.011 (ref 1.005–1.030)
Urobilinogen, Ur: 0.2 mg/dL (ref 0.2–1.0)
pH, UA: 6.5 (ref 5.0–7.5)

## 2020-08-11 LAB — TSH: TSH: 3.25 u[IU]/mL (ref 0.450–4.500)

## 2020-08-11 LAB — MICROSCOPIC EXAMINATION
Bacteria, UA: NONE SEEN
Casts: NONE SEEN /lpf
WBC, UA: NONE SEEN /hpf (ref 0–5)

## 2020-08-11 LAB — LIPID PANEL
Chol/HDL Ratio: 4.9 ratio — ABNORMAL HIGH (ref 0.0–4.4)
Cholesterol, Total: 180 mg/dL (ref 100–199)
HDL: 37 mg/dL — ABNORMAL LOW (ref >39)
LDL Chol Calc (NIH): 127 mg/dL — ABNORMAL HIGH (ref 0–99)
Triglycerides: 84 mg/dL (ref 0–149)
VLDL Cholesterol Cal: 16 mg/dL (ref 5–40)

## 2020-08-11 LAB — RPR: RPR Ser Ql: NONREACTIVE

## 2020-08-11 LAB — HEMOGLOBIN A1C
Est. average glucose Bld gHb Est-mCnc: 105 mg/dL
Hgb A1c MFr Bld: 5.3 % (ref 4.8–5.6)

## 2020-08-11 LAB — HEPATITIS B SURFACE ANTIGEN: Hepatitis B Surface Ag: NEGATIVE

## 2020-08-11 LAB — HEPATITIS C ANTIBODY: Hep C Virus Ab: <0.1 s/co ratio (ref 0.0–0.9)

## 2020-08-12 LAB — URINE CULTURE

## 2020-08-15 LAB — CYTOLOGY - PAP
Chlamydia: NEGATIVE
Comment: NEGATIVE
Comment: NEGATIVE
Comment: NORMAL
Diagnosis: NEGATIVE
Neisseria Gonorrhea: NEGATIVE
Trichomonas: POSITIVE — AB

## 2020-08-15 MED ORDER — METRONIDAZOLE 500 MG PO TABS: 500.0000 mg | ORAL_TABLET | Freq: Two times a day (BID) | ORAL | 0 refills | Status: AC

## 2020-08-15 NOTE — Addendum Note (Signed)
Addended by: Jaynie Collins A on: 08/15/2020 04:01 PM   Modules accepted: Orders

## 2020-08-16 ENCOUNTER — Telehealth: Payer: Self-pay

## 2020-08-16 NOTE — Telephone Encounter (Signed)
Called pt to insure she seen test results. Pt informed she did see the results and picked up medication yesterday. Pt has no further questions at this time.

## 2020-09-13 ENCOUNTER — Other Ambulatory Visit: Payer: Self-pay

## 2020-09-13 ENCOUNTER — Ambulatory Visit (INDEPENDENT_AMBULATORY_CARE_PROVIDER_SITE_OTHER): Payer: 59 | Admitting: *Deleted

## 2020-09-13 ENCOUNTER — Ambulatory Visit: Payer: 59

## 2020-09-13 ENCOUNTER — Other Ambulatory Visit (HOSPITAL_COMMUNITY)
Admission: RE | Admit: 2020-09-13 | Discharge: 2020-09-13 | Disposition: A | Discharge status: Home / Self Care | Payer: 59 | Source: Ambulatory Visit | Attending: Family Medicine | Admitting: Family Medicine

## 2020-09-13 VITALS — BP 143/83 | HR 57

## 2020-09-13 DIAGNOSIS — N760 Acute vaginitis: Secondary | ICD-10-CM

## 2020-09-13 DIAGNOSIS — A5901 Trichomonal vulvovaginitis: Secondary | ICD-10-CM

## 2020-09-13 NOTE — Progress Notes (Signed)
Patient was assessed and managed by nursing staff during this encounter. I have reviewed the chart and agree with the documentation and plan. I have also made any necessary editorial changes.  Arch Methot, MD 09/13/2020 10:54 AM 

## 2020-09-13 NOTE — Progress Notes (Signed)
SUBJECTIVE:  25 y.o. female here for TOC from previous Trichomonas diagnosis. Pt also thinks she may have a yeast infection. Denies abnormal vaginal bleeding or significant pelvic pain or fever. No UTI symptoms. Denies history of known exposure to STD.  No LMP recorded. (Menstrual status: Irregular Periods).  OBJECTIVE:  She appears well, afebrile. Urine dipstick: not done.  ASSESSMENT:  TOC Vaginal Discharge     PLAN:  Trichomonas and  CVAG probe sent to lab. Treatment: To be determined once lab results are received. Advised pt that she can start with monistat OTC and if not better we can send in pill if swab positive for yeast.  ROV prn if symptoms persist or worsen.

## 2020-09-14 ENCOUNTER — Other Ambulatory Visit: Payer: 59

## 2020-09-14 LAB — CERVICOVAGINAL ANCILLARY ONLY
Candida Glabrata: NEGATIVE
Candida Vaginitis: NEGATIVE
Comment: NEGATIVE
Comment: NEGATIVE
Comment: NEGATIVE
Trichomonas: NEGATIVE

## 2020-10-16 ENCOUNTER — Telehealth: Payer: Self-pay

## 2020-10-16 NOTE — Telephone Encounter (Signed)
-----   Message from Dorothyann Peng, MD sent at 10/15/2020 12:21 PM EDT ----- Needs f/u with Raman for elevated blood pressure. She should have regular f/u with RG ----- Message ----- From: Tereso Newcomer, MD Sent: 08/10/2020  10:53 AM EDT To: Dorothyann Peng, MD

## 2020-10-16 NOTE — Telephone Encounter (Signed)
Unable to leave vm. Patient needs follow up for bp.

## 2020-10-20 ENCOUNTER — Ambulatory Visit: Payer: 59 | Admitting: Nurse Practitioner

## 2020-10-31 ENCOUNTER — Ambulatory Visit: Payer: 59 | Admitting: Nurse Practitioner

## 2020-10-31 ENCOUNTER — Telehealth: Payer: Self-pay | Admitting: Internal Medicine

## 2020-10-31 NOTE — Telephone Encounter (Signed)
Called patient to reschedule. Could not leave VM

## 2020-12-21 ENCOUNTER — Encounter: Payer: Self-pay | Admitting: Nurse Practitioner

## 2020-12-21 ENCOUNTER — Ambulatory Visit: Payer: 59 | Admitting: Nurse Practitioner

## 2020-12-21 ENCOUNTER — Other Ambulatory Visit: Payer: Self-pay

## 2020-12-21 VITALS — BP 138/80 | HR 80 | Temp 98.2°F | Ht 66.0 in | Wt 313.8 lb

## 2020-12-21 DIAGNOSIS — M25572 Pain in left ankle and joints of left foot: Secondary | ICD-10-CM | POA: Diagnosis not present

## 2020-12-21 DIAGNOSIS — B353 Tinea pedis: Secondary | ICD-10-CM

## 2020-12-21 DIAGNOSIS — Z6841 Body Mass Index (BMI) 40.0 and over, adult: Secondary | ICD-10-CM | POA: Diagnosis not present

## 2020-12-21 MED ORDER — ECONAZOLE NITRATE 1 % EX CREA: TOPICAL_CREAM | Freq: Every day | CUTANEOUS | 2 refills | Status: AC

## 2020-12-21 NOTE — Patient Instructions (Signed)
Ankle Pain The ankle joint holds your body weight and allows you to move around. Ankle pain can occur on either side or the back of one ankle or both ankles. Ankle pain may be sharp and burning or dull and aching. There may be tenderness, stiffness, redness, or warmth around the ankle. Many things can cause ankle pain, including an injury to the area and overuse of the ankle. Follow these instructions at home: Activity  Rest your ankle as told by your health care provider. Avoid any activities that cause ankle pain.  Do not use the injured limb to support your body weight until your health care provider says that you can. Use crutches as told by your health care provider.  Do exercises as told by your health care provider.  Ask your health care provider when it is safe to drive if you have a brace on your ankle. If you have a brace:  Wear the brace as told by your health care provider. Remove it only as told by your health care provider.  Loosen the brace if your toes tingle, become numb, or turn cold and blue.  Keep the brace clean.  If the brace is not waterproof: ? Do not let it get wet. ? Cover it with a watertight covering when you take a bath or shower. If you were given an elastic bandage:  Remove it when you take a bath or a shower.  Try not to move your ankle very much, but wiggle your toes from time to time. This helps to prevent swelling.  Adjust the bandage to make it more comfortable if it feels too tight.  Loosen the bandage if you have numbness or tingling in your foot or if your foot turns cold and blue.   Managing pain, stiffness, and swelling  If directed, put ice on the painful area. ? If you have a removable brace or elastic bandage, remove it as told by your health care provider. ? Put ice in a plastic bag. ? Place a towel between your skin and the bag. ? Leave the ice on for 20 minutes, 2-3 times a day.  Move your toes often to avoid stiffness and to  lessen swelling.  Raise (elevate) your ankle above the level of your heart while you are sitting or lying down.   General instructions  Record information about your pain. Writing down the following may be helpful for you and your health care provider: ? How often you have ankle pain. ? Where the pain is located. ? What the pain feels like.  If treatment involves wearing a prescribed shoe or insole, make sure you wear it correctly and for as long as told by your health care provider.  Take over-the-counter and prescription medicines only as told by your health care provider.  Keep all follow-up visits as told by your health care provider. This is important. Contact a health care provider if:  Your pain gets worse.  Your pain is not relieved with medicines.  You have a fever or chills.  You are having more trouble with walking.  You have new symptoms. Get help right away if:  Your foot, leg, toes, or ankle: ? Tingles or becomes numb. ? Becomes swollen. ? Turns pale or blue. Summary  Ankle pain can occur on either side or the back of one ankle or both ankles.  Ankle pain may be sharp and burning or dull and aching.  Rest your ankle as told by your health   care provider. If told, apply ice to the area.  Take over-the-counter and prescription medicines only as told by your health care provider. This information is not intended to replace advice given to you by your health care provider. Make sure you discuss any questions you have with your health care provider. Document Revised: 11/10/2018 Document Reviewed: 01/28/2018 Elsevier Patient Education  2021 Elsevier Inc.  

## 2020-12-21 NOTE — Progress Notes (Signed)
I,Lisa Faulkner,acting as a Neurosurgeon for [employer], NP.,have documented all relevant documentation on the behalf of [employer], NP,as directed by  Lisa Ivory, NP while in the presence of Lisa Ivory, NP.  This visit occurred during the SARS-CoV-2 public health emergency.  Safety protocols were in place, including screening questions prior to the visit, additional usage of staff PPE, and extensive cleaning of exam room while observing appropriate contact time as indicated for disinfecting solutions.  Subjective:     Patient ID: Lisa Faulkner , female    DOB: 1995/12/26 , 25 y.o.   MRN: [id number]   Chief Complaint  Patient presents with  . Ankle Pain    HPI  Patient is here for ankle pain. She fell on her ankle about a week ago. Since then her pain has not subsided and she feels that it may be broken. Her pain level is 6/10.  She stated that warm water made it a little better. She can bear weight on it. She is not wearing a brace at this time. The pain level is about a 6 right now. She is also having some athletes foot in both feet for which she has tried OTC medication but has not helped her.   Ankle Pain  The incident occurred 5 to 7 days ago. The incident occurred at home. The injury mechanism was a fall. The pain is present in the left ankle. The quality of the pain is described as aching. The pain is at a severity of 6/10. The pain is moderate. The pain has been fluctuating since onset. She reports no foreign bodies present. The symptoms are aggravated by movement and weight bearing. She has tried nothing for the symptoms.     Past Medical History:  Diagnosis Date  . BMI 40.0-44.9, adult (HCC)   . History of preterm premature rupture of membranes (PPROM)    @ 16wks. G1 pregnancy  . Hypertension   . PCOS (polycystic ovarian syndrome)   . Prediabetes      Family History  Problem Relation Age of Onset  . Hypertension Mother   . Hypertension Father    . Hypertension Brother   . Heart disease Brother   . Stroke Brother   . Stroke Brother 37  . Irritable bowel syndrome Sister   . Diabetes Maternal Grandmother      Current Outpatient Medications:  .  econazole nitrate 1 % cream, Apply topically daily., Disp: 15 g, Rfl: 2   No Known Allergies   Review of Systems  Constitutional: Negative.  Negative for chills and fatigue.  HENT: Negative for rhinorrhea.   Respiratory: Negative.  Negative for cough, chest tightness, shortness of breath and wheezing.   Cardiovascular: Negative.   Gastrointestinal: Negative.   Musculoskeletal: Positive for arthralgias. Negative for myalgias.  Neurological: Negative.      Today's Vitals   12/21/20 1054  BP: 138/80  Pulse: 80  Temp: 98.2 F (36.8 C)  TempSrc: Oral  Weight: (!) 313 lb 12.8 oz (142.3 kg)  Height: 5\' 6"  (1.676 m)   Body mass index is 50.65 kg/m.   Objective:  Physical Exam Constitutional:      Appearance: Normal appearance. She is obese.  HENT:     Head: Normocephalic and atraumatic.  Cardiovascular:     Rate and Rhythm: Normal rate and regular rhythm.     Pulses: Normal pulses.     Heart sounds: Normal heart sounds. No murmur heard.   Musculoskeletal:  General: Swelling and signs of injury present. Normal range of motion.     Right foot: Normal pulse.     Left foot: Swelling and tenderness present. Normal pulse.     Comments: Swelling and tenderness to the left foot.   Skin:    General: Skin is warm and dry.     Capillary Refill: Capillary refill takes less than 2 seconds.  Neurological:     Mental Status: She is alert.         Assessment And Plan:     1. Acute left ankle pain -Patient hurt her ankle about a week ago. She has tried nothing for pain relief. I have recommended to the patient to go to [facility] today so they can get a xray there quicker. The patient agreed and will be going to [facility] today after this office visit. I have  recommended the patient elevate her feet and use a brace and not put any weight on her left foot until she is seen by a specialist at [facility] for further evaluation and xray.   2. Tinea pedis of both feet - econazole nitrate 1 % cream; Apply topically daily.  Dispense: 15 g; Refill: 2   Side effects and appropriate use of all the medication(s) were discussed with the patient today. Patient advised to use the medication(s) as directed by their healthcare provider. The patient was encouraged to read, review, and understand all associated package inserts and contact our office with any questions or concerns. The patient accepts the risks of the treatment plan and had an opportunity to ask questions.   The patient was encouraged to call or send a message through MyChart for any questions or concerns.   Follow up: if symptoms persist or do not get better.   Patient was given opportunity to ask questions. Patient verbalized understanding of the plan and was able to repeat key elements of the plan. All questions were answered to their satisfaction.  Lisa Federico Maiorino, DNP   I, Lisa Faulkner have reviewed all documentation for this visit. The documentation on 12/21/20 for the exam, diagnosis, procedures, and orders are all accurate and complete.    IF YOU HAVE BEEN REFERRED TO A SPECIALIST, IT MAY TAKE 1-2 WEEKS TO SCHEDULE/PROCESS THE REFERRAL. IF YOU HAVE NOT HEARD FROM US/SPECIALIST IN TWO WEEKS, PLEASE GIVE [place] A CALL AT [phone].   THE PATIENT IS ENCOURAGED TO PRACTICE SOCIAL DISTANCING DUE TO THE COVID-19 PANDEMIC.

## 2021-01-24 ENCOUNTER — Encounter: Payer: 59 | Admitting: Nurse Practitioner

## 2021-04-24 ENCOUNTER — Telehealth: Payer: Self-pay

## 2021-04-24 NOTE — Telephone Encounter (Signed)
Pt requesting copy of COVID vaccine information

## 2021-06-13 ENCOUNTER — Other Ambulatory Visit: Payer: Self-pay | Admitting: Obstetrics and Gynecology

## 2021-06-13 ENCOUNTER — Other Ambulatory Visit: Payer: Self-pay

## 2021-06-13 ENCOUNTER — Ambulatory Visit
Admission: RE | Admit: 2021-06-13 | Discharge: 2021-06-13 | Disposition: A | Discharge status: Home / Self Care | Payer: No Typology Code available for payment source | Source: Ambulatory Visit | Attending: Obstetrics and Gynecology | Admitting: Obstetrics and Gynecology

## 2021-06-13 DIAGNOSIS — Z111 Encounter for screening for respiratory tuberculosis: Secondary | ICD-10-CM

## 2021-07-05 ENCOUNTER — Encounter: Payer: Self-pay | Admitting: Radiology

## 2021-07-26 ENCOUNTER — Emergency Department (HOSPITAL_BASED_OUTPATIENT_CLINIC_OR_DEPARTMENT_OTHER)
Admission: EM | Admit: 2021-07-26 | Discharge: 2021-07-26 | Disposition: A | Discharge status: Home / Self Care | Payer: No Typology Code available for payment source | Attending: Emergency Medicine | Admitting: Emergency Medicine

## 2021-07-26 ENCOUNTER — Other Ambulatory Visit: Payer: Self-pay

## 2021-07-26 ENCOUNTER — Encounter (HOSPITAL_BASED_OUTPATIENT_CLINIC_OR_DEPARTMENT_OTHER): Payer: Self-pay

## 2021-07-26 DIAGNOSIS — S0990XA Unspecified injury of head, initial encounter: Secondary | ICD-10-CM

## 2021-07-26 DIAGNOSIS — I10 Essential (primary) hypertension: Secondary | ICD-10-CM | POA: Insufficient documentation

## 2021-07-26 DIAGNOSIS — Y92410 Unspecified street and highway as the place of occurrence of the external cause: Secondary | ICD-10-CM | POA: Insufficient documentation

## 2021-07-26 DIAGNOSIS — S0093XA Contusion of unspecified part of head, initial encounter: Secondary | ICD-10-CM | POA: Insufficient documentation

## 2021-07-26 MED ORDER — IBUPROFEN 400 MG PO TABS
600.0000 mg | ORAL_TABLET | Freq: Once | ORAL | Status: AC
  Administered 2021-07-26: 15:00:00 600 mg via ORAL
  Filled 2021-07-26: qty 1
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
## 2022-02-08 IMAGING — CR DG CHEST 2V
2 series · 2 of 2 positions shown · non-contrast
Comparison: X-ray chest 08/14/2018.

CLINICAL DATA: Tuberculosis screening test.

EXAM:
CHEST - 2 VIEW

[w chest pa]
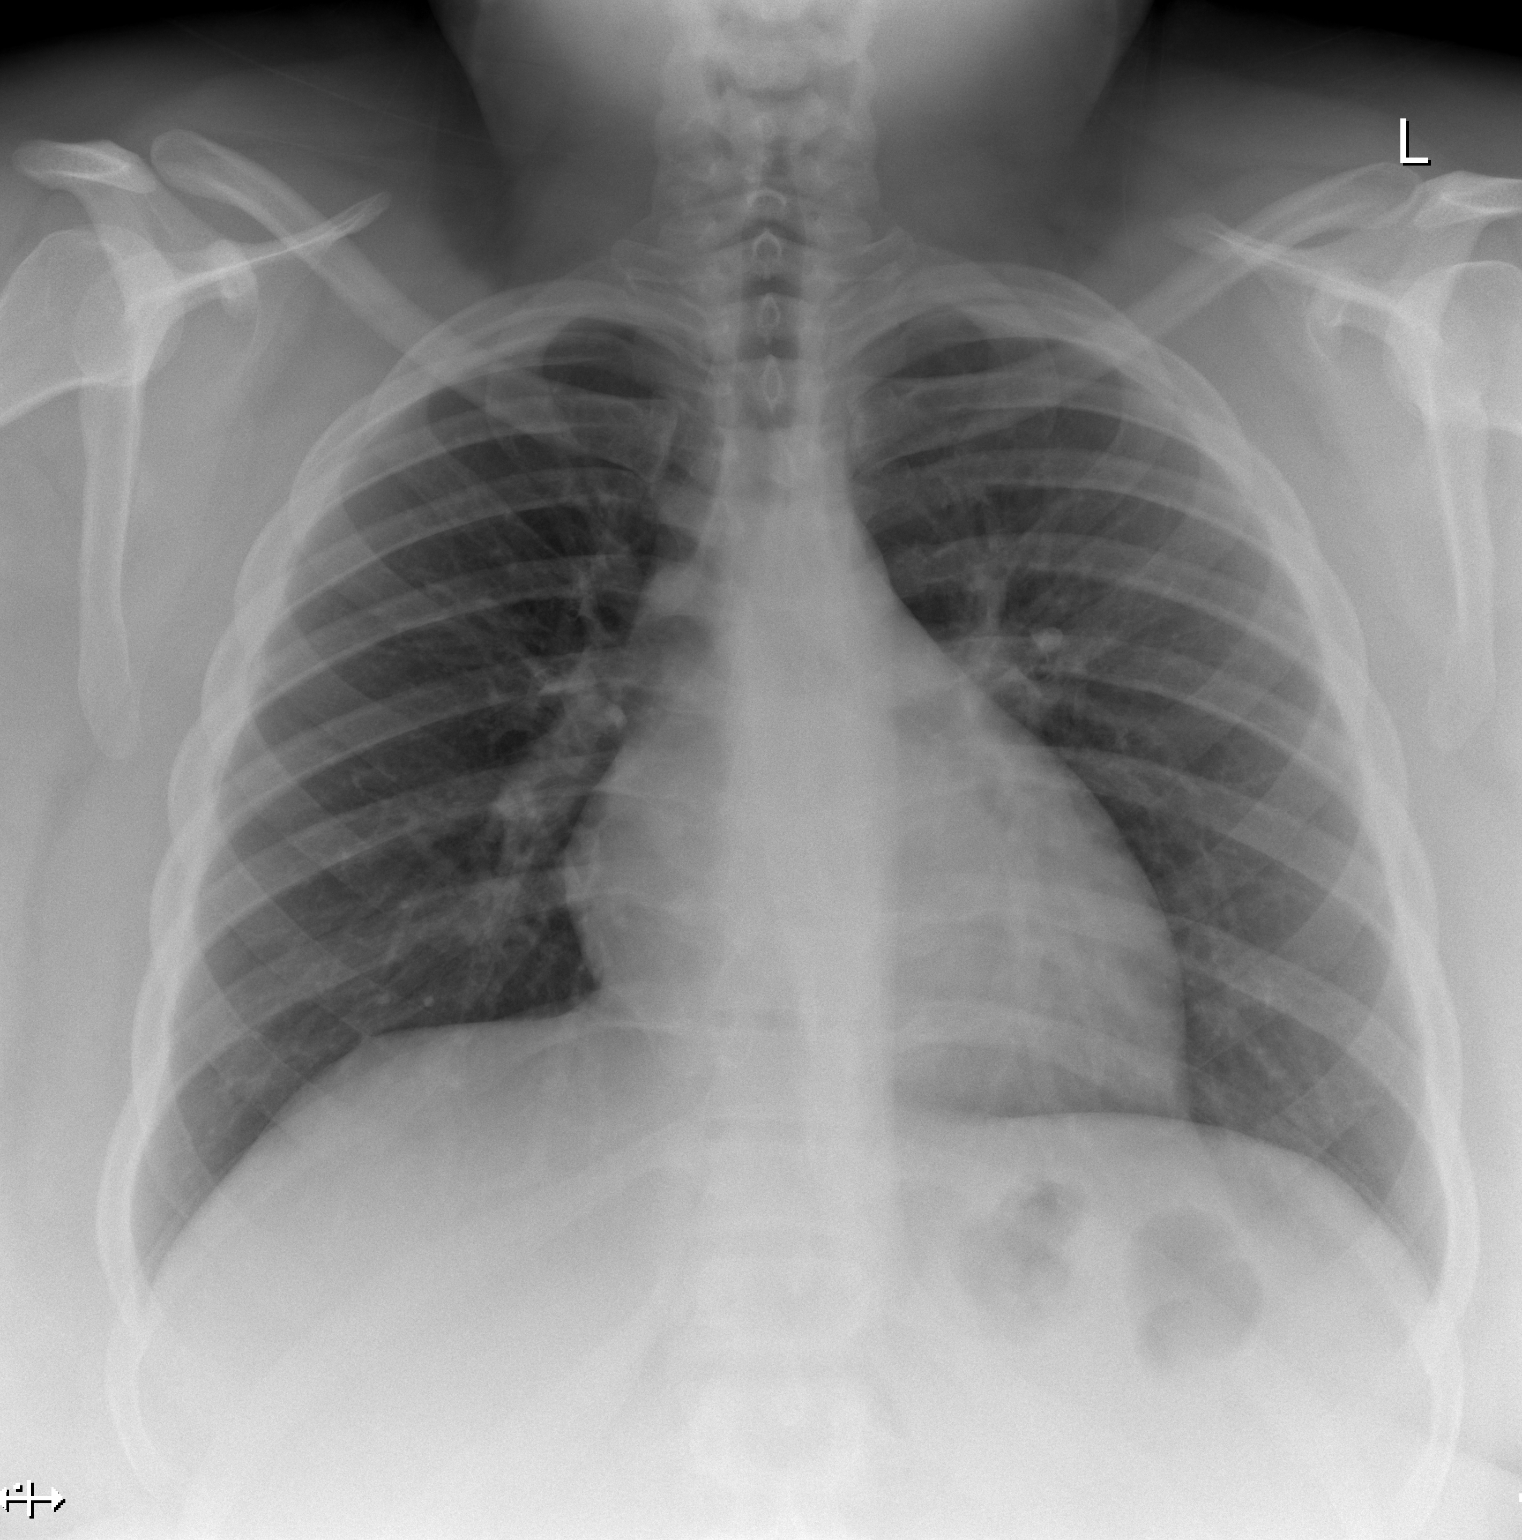

[w chest lat]
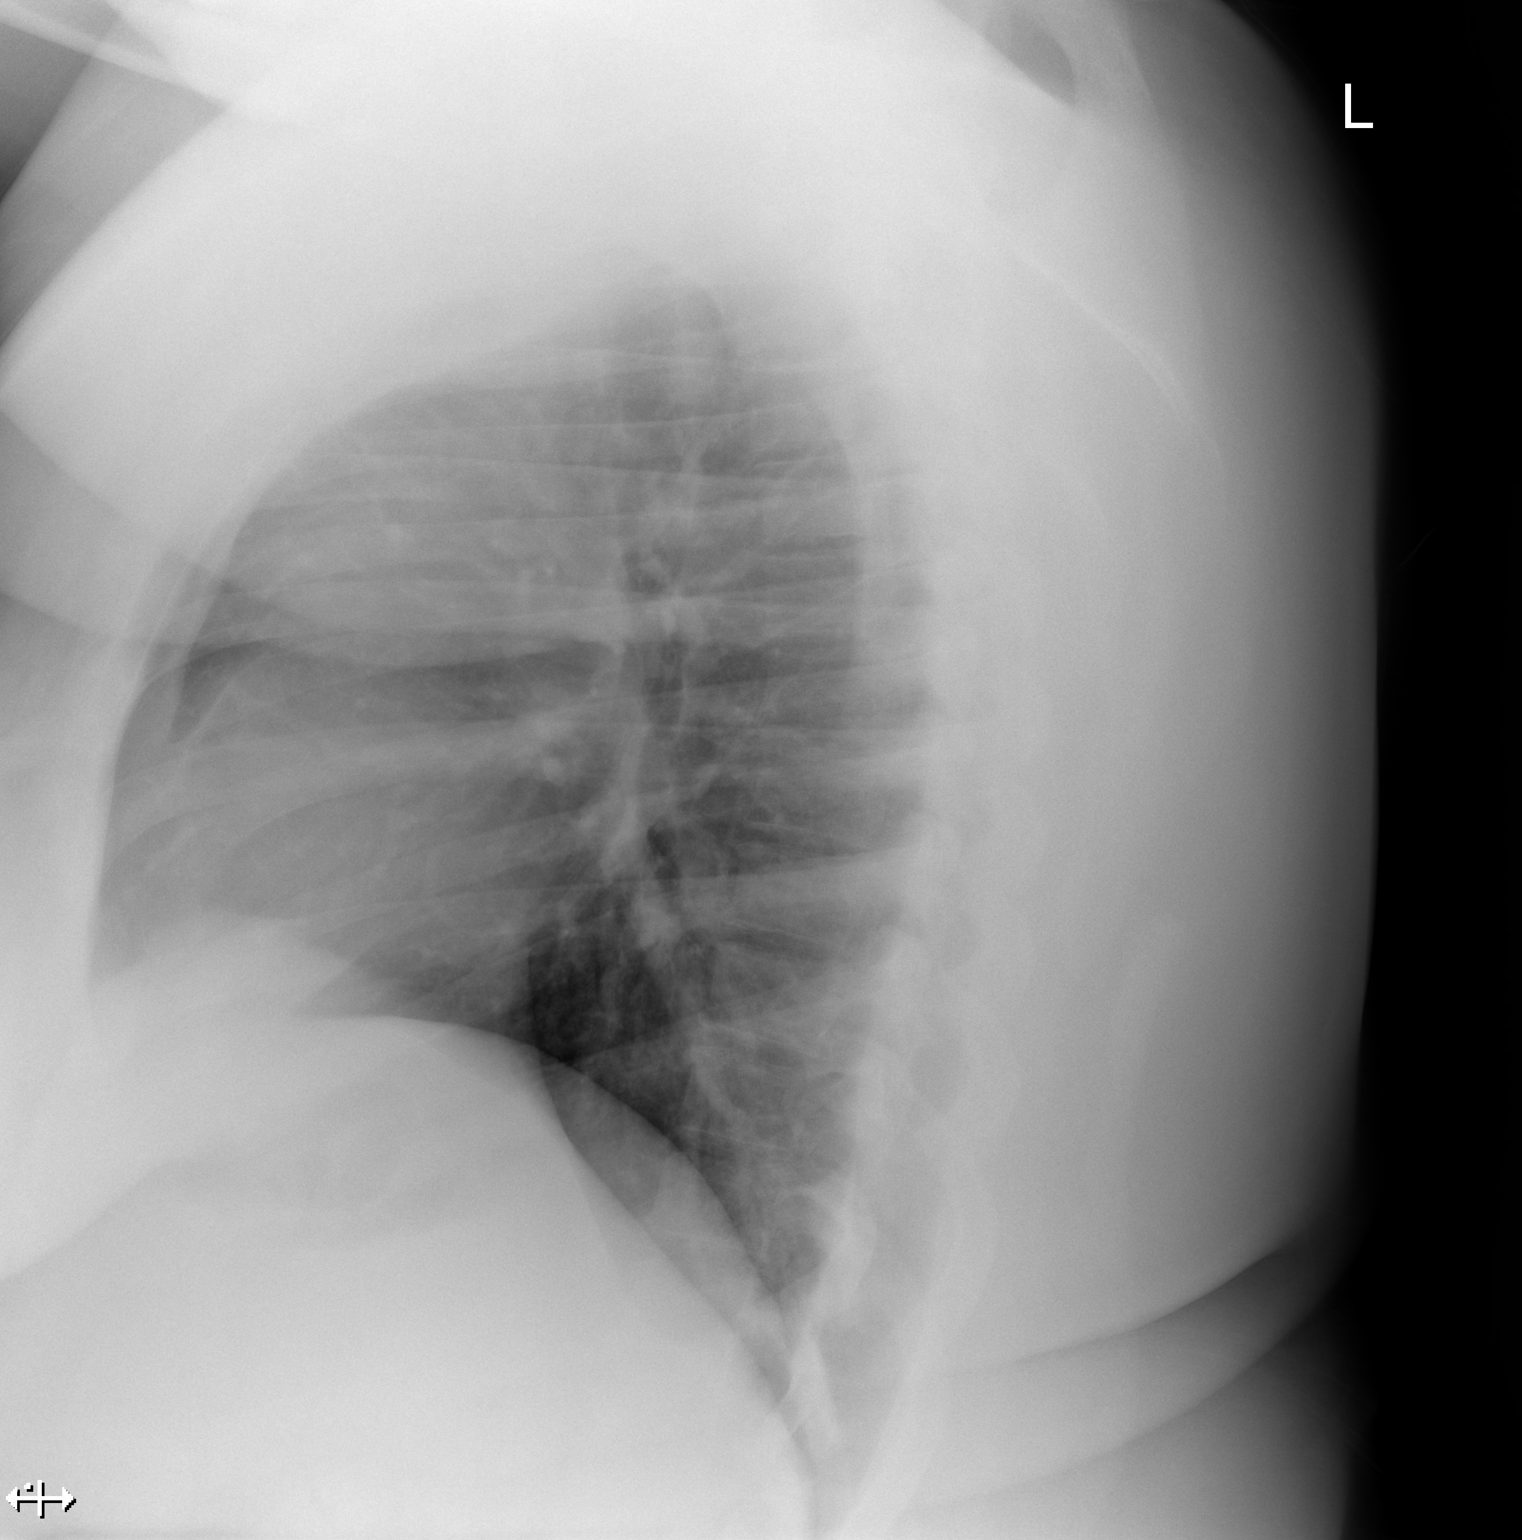

[2 of 2 positions shown; findings below may reference images not displayed]

FINDINGS: The heart size and mediastinal contours are within normal limits.
Both lungs are clear. The visualized skeletal structures are
unremarkable.
IMPRESSION: Normal chest x-ray.  No evidence to suggest active TB infection.

## 2022-09-15 ENCOUNTER — Other Ambulatory Visit: Payer: Self-pay

## 2022-09-15 ENCOUNTER — Encounter (HOSPITAL_BASED_OUTPATIENT_CLINIC_OR_DEPARTMENT_OTHER): Payer: Self-pay | Admitting: Emergency Medicine

## 2022-09-15 DIAGNOSIS — O200 Threatened abortion: Secondary | ICD-10-CM | POA: Insufficient documentation

## 2022-09-15 DIAGNOSIS — I10 Essential (primary) hypertension: Secondary | ICD-10-CM | POA: Insufficient documentation

## 2022-09-15 DIAGNOSIS — Z3A08 8 weeks gestation of pregnancy: Secondary | ICD-10-CM | POA: Diagnosis not present

## 2022-09-15 DIAGNOSIS — O209 Hemorrhage in early pregnancy, unspecified: Secondary | ICD-10-CM | POA: Diagnosis present

## 2022-09-15 LAB — CBC
HCT: 31.5 % — ABNORMAL LOW (ref 36.0–46.0)
Hemoglobin: 10.3 g/dL — ABNORMAL LOW (ref 12.0–15.0)
MCH: 27.9 pg (ref 26.0–34.0)
MCHC: 32.7 g/dL (ref 30.0–36.0)
MCV: 85.4 fL (ref 80.0–100.0)
Platelets: 233 10*3/uL (ref 150–400)
RBC: 3.69 MIL/uL — ABNORMAL LOW (ref 3.87–5.11)
RDW: 15.5 % (ref 11.5–15.5)
WBC: 10.6 10*3/uL — ABNORMAL HIGH (ref 4.0–10.5)
nRBC: 0 % (ref 0.0–0.2)

## 2022-09-15 LAB — HCG, QUANTITATIVE, PREGNANCY: hCG, Beta Chain, Quant, S: 8735 m[IU]/mL — ABNORMAL HIGH (ref <5)

## 2022-09-15 NOTE — ED Triage Notes (Signed)
Pt via pov from home with possible miscarriage. She has not yet seen a doctor for the pregnancy, but believes she is about [redacted] weeks along. Pt states she started spotting earlier today and has had some clotting and is now having somewhat heavy bleeding, as well as back pain and cramping. This is her 2nd pregnancy. Pt alert  & oriented, nad noted.

## 2022-09-16 ENCOUNTER — Emergency Department (HOSPITAL_BASED_OUTPATIENT_CLINIC_OR_DEPARTMENT_OTHER)
Admission: EM | Admit: 2022-09-16 | Discharge: 2022-09-16 | Disposition: A | Discharge status: Home / Self Care | Payer: Managed Care, Other (non HMO) | Attending: Emergency Medicine | Admitting: Emergency Medicine

## 2022-09-16 ENCOUNTER — Emergency Department (HOSPITAL_BASED_OUTPATIENT_CLINIC_OR_DEPARTMENT_OTHER)
Admission: RE | Admit: 2022-09-16 | Discharge: 2022-09-16 | Disposition: A | Discharge status: Home / Self Care | Payer: Managed Care, Other (non HMO) | Source: Ambulatory Visit | Attending: Emergency Medicine | Admitting: Emergency Medicine

## 2022-09-16 DIAGNOSIS — Z3A01 Less than 8 weeks gestation of pregnancy: Secondary | ICD-10-CM | POA: Insufficient documentation

## 2022-09-16 DIAGNOSIS — O208 Other hemorrhage in early pregnancy: Secondary | ICD-10-CM | POA: Insufficient documentation

## 2022-09-16 DIAGNOSIS — O200 Threatened abortion: Secondary | ICD-10-CM

## 2022-09-16 NOTE — ED Notes (Signed)
Patient verbalized understanding of discharge instructions and reasons to return to the ED 

## 2022-09-16 NOTE — Discharge Instructions (Signed)
You were seen today for bleeding during early pregnancy.  This sometimes represents a miscarriage.  You need to return later today for ultrasound.  This will be scheduled for you.  If you start bleeding through more than 1 pad per hour or have significant dizziness, you should be reevaluated immediately.

## 2022-09-16 NOTE — ED Provider Notes (Signed)
[facility] Provider Note   CSN: [id number] Arrival date & time: 09/15/22  2122     History  Chief Complaint  Patient presents with   Vaginal Bleeding    Rumi Cerna is a 27 y.o. female.  HPI     This is a 27 year old female G2 P1 who presents with concern for possible miscarriage.  Patient reports she began to have vaginal bleeding yesterday morning.  It started off with light spotting but progressed to bleeding and clots this afternoon.  She states she has had some abdominal cramping.  Cramping does not lateralize.  She has a history of PPROM at 16 weeks with prior pregnancy with likely placental abruption.  States that her cramping has improved since waiting in the waiting room.  Reports some dizziness.  Last menstrual period was December 15.  Home Medications Prior to Admission medications   Medication Sig Start Date End Date Taking? Authorizing Provider  econazole nitrate 1 % cream Apply topically daily. 12/21/20   Bary Castilla, NP      Allergies    Patient has no known allergies.    Review of Systems   Review of Systems  Constitutional:  Negative for fever.  Gastrointestinal:  Positive for abdominal pain.  Genitourinary:  Positive for vaginal bleeding.  All other systems reviewed and are negative.   Physical Exam Updated Vital Signs BP (!) 156/84   Pulse 79   Temp 99 F (37.2 C) (Oral)   Resp 16   Ht 1.676 m (5' 6"$ )   Wt (!) 158.8 kg   LMP 07/19/2022 (Approximate)   SpO2 98%   BMI 56.49 kg/m  Physical Exam Vitals and nursing note reviewed.  Constitutional:      Appearance: She is well-developed. She is obese. She is not ill-appearing.  HENT:     Head: Normocephalic and atraumatic.  Eyes:     Pupils: Pupils are equal, round, and reactive to light.  Cardiovascular:     Rate and Rhythm: Normal rate and regular rhythm.     Heart sounds: Normal heart sounds.  Pulmonary:     Effort: Pulmonary  effort is normal. No respiratory distress.  Abdominal:     General: Bowel sounds are normal.     Palpations: Abdomen is soft.     Tenderness: There is no abdominal tenderness.  Genitourinary:    Comments: Scant blood in the vaginal vault, no active bleeding, cervical os closed, no adnexal masses or tenderness to palpation Musculoskeletal:     Cervical back: Neck supple.  Skin:    General: Skin is warm and dry.  Neurological:     Mental Status: She is alert and oriented to person, place, and time.  Psychiatric:        Mood and Affect: Mood normal.     ED Results / Procedures / Treatments   Labs (all labs ordered are listed, but only abnormal results are displayed) Labs Reviewed  CBC - Abnormal; Notable for the following components:      Result Value   WBC 10.6 (*)    RBC 3.69 (*)    Hemoglobin 10.3 (*)    HCT 31.5 (*)    All other components within normal limits  HCG, QUANTITATIVE, PREGNANCY - Abnormal; Notable for the following components:   hCG, Beta Chain, Quant, S 8,735 (*)    All other components within normal limits  PREGNANCY, URINE    EKG None  Radiology No results found.  Procedures Ultrasound  ED OB Pelvic  Date/Time: 09/16/2022 2:57 AM  Performed by: Merryl Hacker, MD Authorized by: Merryl Hacker, MD   Procedure details:    Indications: evaluate for IUP     Assess:  Intrauterine pregnancy   Technique:  Transabdominal obstetric (HCG+) exam   Images: not archived   Study Limitations: body habitus Uterine findings:    Endometrial stripe: identified     Intrauterine pregnancy: not identified     Single gestation: not identified     Gestational sac: not identified     Yolk sac: not identified     Fetal pole: not identified     Fetal heart rate: not identified      Left ovary findings:    Left ovary:  Visualized   Adnexal mass: not identified Right ovary findings:     Right ovary:  Not visualized    Other findings:    Free pelvic  fluid: not identified     Free peritoneal fluid: not identified   Comments:     Exam significantly limited by transabdominal approach and body habitus.  Endometrial stripe noted.  No IUP identified although significantly limited by technique.  Do not see any adnexal masses.  No free fluid in the pelvis.     Medications Ordered in ED Medications - No data to display  ED Course/ Medical Decision Making/ A&P                             Medical Decision Making Amount and/or Complexity of Data Reviewed Labs: ordered. Radiology: ordered.   This patient presents to the ED for concern of bleeding during early pregnancy, this involves an extensive number of treatment options, and is a complaint that carries with it a high risk of complications and morbidity.  I considered the following differential and admission for this acute, potentially life threatening condition.  The differential diagnosis includes miscarriage, ectopic, subchorionic hemorrhage  MDM:    This is a 27 year old female who presents with vaginal bleeding in early pregnancy.  Last menstrual period December 17.  Positive pregnancy test at home.  Has not seen OB/GYN.  Has not had any prenatal visits or ultrasound.  Reports worsening vaginal bleeding over the last 24 hours.  She is nontoxic and vital signs are reassuring.  Review of her chart reveals that she was O+.  Beta hCG 8000.  Based on last menstrual period, patient would be approximately 8 weeks 3 days pregnant.  This is quite low for that gestation.  Does not require RhoGAM.  No significant hemorrhage or bleeding on exam.  Cervical os is closed.  Bedside ultrasound while limited does not show an IUP.  It also does not show definitive ectopic or pelvic fluid.  She has a nontender abdomen.  Would favor miscarriage over ectopic although this cannot be completely ruled out.  Given that she is hemodynamically stable without ongoing bleeding and without significant tenderness on exam,  will order formal ultrasound with transvaginal.  (Labs, imaging, consults)  Labs: I Ordered, and personally interpreted labs.  The pertinent results include: Hemoglobin 10.3.  Beta-hCG 8735  Imaging Studies ordered: I ordered imaging studies including bedside ultrasound I independently visualized and interpreted imaging. I agree with the radiologist interpretation  Additional history obtained from chart review.  External records from outside source obtained and reviewed including prior OB records  Cardiac Monitoring: The patient was not maintained on a cardiac monitor.  If on  the cardiac monitor, I personally viewed and interpreted the cardiac monitored which showed an underlying rhythm of: N/A  Reevaluation: After the interventions noted above, I reevaluated the patient and found that they have :improved  Social Determinants of Health:  lives independently  Disposition: Discharge with formal ultrasound later today  Co morbidities that complicate the patient evaluation  Past Medical History:  Diagnosis Date   BMI 40.0-44.9, adult ([place])    History of preterm premature rupture of membranes (PPROM)    @ 16wks. G1 pregnancy   Hypertension    PCOS (polycystic ovarian syndrome)    Prediabetes      Medicines No orders of the defined types were placed in this encounter.   I have reviewed the patients home medicines and have made adjustments as needed  Problem List / ED Course: Problem List Items Addressed This Visit   None Visit Diagnoses     Threatened miscarriage    -  Primary                   Final Clinical Impression(s) / ED Diagnoses Final diagnoses:  Threatened miscarriage    Rx / DC Orders ED Discharge Orders          Ordered    US OB Transvaginal        09/16/22 0255              Merryl Hacker, MD 09/16/22 0302

## 2022-09-16 NOTE — ED Notes (Signed)
Patient sitting up.  Waiting on MD to return.  She denies any complaints at this time.  She is drinking fluids

## 2022-11-08 ENCOUNTER — Emergency Department (HOSPITAL_BASED_OUTPATIENT_CLINIC_OR_DEPARTMENT_OTHER)
Admission: EM | Admit: 2022-11-08 | Discharge: 2022-11-08 | Disposition: A | Discharge status: Home / Self Care | Payer: Self-pay | Attending: Emergency Medicine | Admitting: Emergency Medicine

## 2022-11-08 ENCOUNTER — Encounter (HOSPITAL_BASED_OUTPATIENT_CLINIC_OR_DEPARTMENT_OTHER): Payer: Self-pay | Admitting: Emergency Medicine

## 2022-11-08 ENCOUNTER — Emergency Department (HOSPITAL_BASED_OUTPATIENT_CLINIC_OR_DEPARTMENT_OTHER): Payer: Self-pay | Admitting: Radiology

## 2022-11-08 ENCOUNTER — Other Ambulatory Visit: Payer: Self-pay

## 2022-11-08 DIAGNOSIS — S8991XA Unspecified injury of right lower leg, initial encounter: Secondary | ICD-10-CM | POA: Insufficient documentation

## 2022-11-08 DIAGNOSIS — M25461 Effusion, right knee: Secondary | ICD-10-CM | POA: Insufficient documentation

## 2022-11-08 DIAGNOSIS — W19XXXA Unspecified fall, initial encounter: Secondary | ICD-10-CM | POA: Insufficient documentation

## 2022-11-08 DIAGNOSIS — Y93K1 Activity, walking an animal: Secondary | ICD-10-CM | POA: Insufficient documentation

## 2022-11-08 MED ORDER — NAPROXEN 250 MG PO TABS
500.0000 mg | ORAL_TABLET | Freq: Once | ORAL | Status: AC
  Administered 2022-11-08: 500 mg via ORAL
  Filled 2022-11-08: qty 2

## 2022-11-08 NOTE — ED Triage Notes (Signed)
Pt arrives to ED with c/o right knee pain. This happened today after she slipped and fell while walking her dog. She landed on her back but twisted her knee while falling.

## 2022-11-08 NOTE — Discharge Instructions (Addendum)
Please follow-up with your PCP, as indicated.  Take ibuprofen, Tylenol for pain control.  You can use the knee immobilizer to help with your strength and help with walking.  He can also use an Ace wrap to help with this as well.  If symptoms are not improving, in the next week or so please follow-up with orthopedics.  If you have loss of sensation or pallor in your leg return to the ED.

## 2022-11-08 NOTE — ED Notes (Signed)
Discharge instructions, pain management, and follow up care reviewed and explained, pt verbalized understanding and had no further questions on d/c.  

## 2022-11-08 NOTE — ED Provider Notes (Signed)
[facility]   CSN: [id number] Arrival date & time: 11/08/22  1415     History  Chief Complaint  Patient presents with   Knee Pain    Royann ShiversKieshawn Well-Benjamin is a 27 y.o. female, who presents to the ED secondary to right knee pain.  States that she slipped and fell while walking her dog yesterday and landed on her back and twisted her knee while falling.  She denies any back pain, loss of bowel, bladder.  States that her right knee only hurts, or in the middle.  Has not taken anything for this pain.  Denies any head trauma, no loss of consciousness, no blood thinner use.    Home Medications Prior to Admission medications   Medication Sig Start Date End Date Taking? Authorizing Provider  econazole nitrate 1 % cream Apply topically daily. 12/21/20   Charlesetta IvoryGhumman, Ramandeep, NP      Allergies    Patient has no known allergies.    Review of Systems   Review of Systems  Musculoskeletal:  Negative for back pain.       +R knee pain    Physical Exam Updated Vital Signs BP (!) 149/78 (BP Location: Right Arm)   Pulse 90   Temp 98 F (36.7 C)   Resp 16   Ht 5\' 6"  (1.676 m)   Wt (!) 158.8 kg   LMP 10/23/2022 (Approximate)   SpO2 99%   Breastfeeding Unknown   BMI 56.49 kg/m  Physical Exam Vitals and nursing note reviewed.  Constitutional:      General: She is not in acute distress.    Appearance: She is well-developed.  HENT:     Head: Normocephalic and atraumatic.  Eyes:     General:        Right eye: No discharge.        Left eye: No discharge.     Conjunctiva/sclera: Conjunctivae normal.  Pulmonary:     Effort: No respiratory distress.  Musculoskeletal:     Comments: Right Knee: Tenderness to palpation of medial joint line. An effusion is present.  Negative anterior and posterior drawer. Negative Mcmurray's. +Patellar stability. Negative valgus and varus stress test.. Extension and flexion intact. No sensory  deficits.    Neurological:     Mental Status: She is alert.     Comments: Clear speech.   Psychiatric:        Behavior: Behavior normal.        Thought Content: Thought content normal.     ED Results / Procedures / Treatments   Labs (all labs ordered are listed, but only abnormal results are displayed) Labs Reviewed - No data to display  EKG None  Radiology DG Knee Complete 4 Views Right  Result Date: 11/08/2022 CLINICAL DATA:  Knee pain, fall EXAM: RIGHT KNEE - COMPLETE 4+ VIEW COMPARISON:  None Available. FINDINGS: Early spurring best seen in the lateral compartment. Joint spaces are maintained. Damiyah Ditmars joint effusion. Appears to be irregularity within the lateral femoral condyle along the articular surface which could reflect osteochondritis dissecans. No acute fracture, subluxation or dislocation visualized. 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 Early spurring in the lateral compartment. Nolan Lasser joint effusion Electronically Signed   By: Charlett NoseKevin  Dover M.D.   On: 11/08/2022 15:49    Procedures Procedures    Medications Ordered in ED Medications  naproxen (NAPROSYN) tablet 500 mg (500 mg Oral Given 11/08/22 1631)  ED Course/ Medical Decision Making/ A&P                             Medical Decision Making Patient is a 27 year old female, here for Hu that occurred yesterday when she tripped.  No head injury, landed on her back, no complaint of back pain.  States she twisted her right knee, has some pain now.  Tenderness to palpation of the medial joint line.  No laxity.  Able to weight-bear, but just very painful.  Obtain x-ray for evaluation.  Reflexes intact.  Amount and/or Complexity of Data Reviewed Radiology: ordered.    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  Additionally I encouraged her to follow-up with her PCP and orthopedics in about 1 week.  She may have a Karletta Millay meniscal tear, however it is unclear at this time.  She does not seem to have any laxity, requiring any kind of surgical intervention at this time.  Risk Prescription drug management.    Final Clinical Impression(s) / ED Diagnoses Final diagnoses:  Knee injury, right, initial encounter  Knee effusion, right    Rx / DC Orders ED Discharge Orders     None         Marguerite Jarboe, Harley [place], PA 11/08/22 1657    Mardene Sayer, MD 11/08/22 2310

## 2024-03-17 ENCOUNTER — Ambulatory Visit: Admitting: Podiatry

## 2024-03-19 ENCOUNTER — Ambulatory Visit (INDEPENDENT_AMBULATORY_CARE_PROVIDER_SITE_OTHER): Admitting: Podiatry

## 2024-03-19 ENCOUNTER — Encounter: Payer: Self-pay | Admitting: Podiatry

## 2024-03-19 DIAGNOSIS — B353 Tinea pedis: Secondary | ICD-10-CM | POA: Diagnosis not present

## 2024-03-19 MED ORDER — KETOCONAZOLE 2 % EX CREA: 1.0000 | TOPICAL_CREAM | Freq: Every day | CUTANEOUS | 0 refills | Status: AC

## 2024-03-19 MED ORDER — TERBINAFINE HCL 250 MG PO TABS: 250.0000 mg | ORAL_TABLET | Freq: Every day | ORAL | 0 refills | Status: AC

## 2024-03-19 NOTE — Progress Notes (Unsigned)
       Chief Complaint  Patient presents with   Tinea Pedis    Bilateral burning and itching under and between toes.  Started 2 years ago.  Took a steroid last year from telehealth. Non Diabetic. No anti coag     HPI: 28 y.o. female presents today complaint of itching, burning rash on the palms of both feet and between the toes.  She has tried topical steroid creams.  Is been ongoing for over a year.  Past Medical History:  Diagnosis Date   BMI 40.0-44.9, adult (HCC)    History of preterm premature rupture of membranes (PPROM)    @ 16wks. G1 pregnancy   Hypertension    PCOS (polycystic ovarian syndrome)    Prediabetes     Past Surgical History:  Procedure Laterality Date   KNEE SURGERY     WISDOM TOOTH EXTRACTION      No Known Allergies  ROS    Physical Exam: There were no vitals filed for this visit.  General: The patient is alert and oriented x3 in no acute distress.  Dermatology: Bilateral pedal skin and in moccasin distribution there is erythematous, flaky, peeling dry skin rash that is pruritic in nature.  There is also some maceration affecting the fourth webspaces.  Vascular: Palpable pedal pulses bilaterally. Capillary refill within normal limits.  No appreciable edema.  No erythema or calor.  Neurological: Light touch sensation grossly intact bilateral feet.   Musculoskeletal Exam: No pedal deformities noted   Assessment/Plan of Care: 1. Tinea pedis of both feet      Meds ordered this encounter  Medications   terbinafine  (LAMISIL ) 250 MG tablet    Sig: Take 1 tablet (250 mg total) by mouth daily for 14 days.    Dispense:  14 tablet    Refill:  0   ketoconazole  (NIZORAL ) 2 % cream    Sig: Apply 1 Application topically daily.    Dispense:  60 g    Refill:  0   None  Discussed clinical findings with patient today.  Findings consistent with tinea pedis.  Discussed importance of pedal hygiene in the management and treatment of tinea pedis.  Sent  prescription for topical ketoconazole  cream 2% to be applied daily over the next 3 weeks.  Also prescribing oral terbinafine  due to the chronicity of the skin infection overall extent of involvement for 2 weeks.  Upon completion of course, continue to moisturize feet and exfoliate.  Reevaluate in about 3 weeks.   Laiklynn Raczynski L. Lamount MAUL, AACFAS [facility]     [address] [place], [place] [address]                Office [phone]  Fax [phone]
